# Patient Record
Sex: Female | Born: 1985 | Race: Black or African American | Hispanic: No | Marital: Married | Smoking: Former smoker
Health system: Southern US, Community
[De-identification: ages and names within clinical notes are randomized; demographics above are authoritative.]

## PROBLEM LIST (undated history)

## (undated) DIAGNOSIS — J45909 Unspecified asthma, uncomplicated: Secondary | ICD-10-CM

## (undated) DIAGNOSIS — B009 Herpesviral infection, unspecified: Secondary | ICD-10-CM

## (undated) DIAGNOSIS — I10 Essential (primary) hypertension: Secondary | ICD-10-CM

## (undated) HISTORY — PX: OVARIAN CYST SURGERY: SHX726

## (undated) HISTORY — DX: Herpesviral infection, unspecified: B00.9

---
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
## 2015-09-11 DIAGNOSIS — Z2839 Other underimmunization status: Secondary | ICD-10-CM | POA: Insufficient documentation

## 2015-09-11 DIAGNOSIS — R768 Other specified abnormal immunological findings in serum: Secondary | ICD-10-CM | POA: Insufficient documentation

## 2016-03-04 DIAGNOSIS — O139 Gestational [pregnancy-induced] hypertension without significant proteinuria, unspecified trimester: Secondary | ICD-10-CM

## 2016-07-15 DIAGNOSIS — J4520 Mild intermittent asthma, uncomplicated: Secondary | ICD-10-CM | POA: Insufficient documentation

## 2019-03-30 DIAGNOSIS — Z8632 Personal history of gestational diabetes: Secondary | ICD-10-CM

## 2019-03-30 DIAGNOSIS — Z8759 Personal history of other complications of pregnancy, childbirth and the puerperium: Secondary | ICD-10-CM | POA: Insufficient documentation

## 2019-03-30 HISTORY — DX: Personal history of gestational diabetes: Z86.32

## 2019-07-26 DIAGNOSIS — Z419 Encounter for procedure for purposes other than remedying health state, unspecified: Secondary | ICD-10-CM | POA: Diagnosis not present

## 2019-08-03 ENCOUNTER — Other Ambulatory Visit: Payer: Self-pay

## 2019-08-03 ENCOUNTER — Emergency Department (HOSPITAL_COMMUNITY)
Admission: EM | Admit: 2019-08-03 | Discharge: 2019-08-03 | Disposition: A | Discharge status: Home / Self Care | Payer: Medicaid Other | Attending: Emergency Medicine | Admitting: Emergency Medicine

## 2019-08-03 ENCOUNTER — Emergency Department (HOSPITAL_COMMUNITY): Payer: Medicaid Other

## 2019-08-03 ENCOUNTER — Encounter (HOSPITAL_COMMUNITY): Payer: Self-pay | Admitting: Emergency Medicine

## 2019-08-03 DIAGNOSIS — R079 Chest pain, unspecified: Secondary | ICD-10-CM | POA: Diagnosis not present

## 2019-08-03 DIAGNOSIS — R072 Precordial pain: Secondary | ICD-10-CM | POA: Diagnosis not present

## 2019-08-03 DIAGNOSIS — O26891 Other specified pregnancy related conditions, first trimester: Secondary | ICD-10-CM | POA: Insufficient documentation

## 2019-08-03 DIAGNOSIS — Z3201 Encounter for pregnancy test, result positive: Secondary | ICD-10-CM

## 2019-08-03 DIAGNOSIS — Z3A00 Weeks of gestation of pregnancy not specified: Secondary | ICD-10-CM | POA: Insufficient documentation

## 2019-08-03 LAB — CBC
HCT: 43.5 % (ref 36.0–46.0)
Hemoglobin: 14.6 g/dL (ref 12.0–15.0)
MCH: 31.2 pg (ref 26.0–34.0)
MCHC: 33.6 g/dL (ref 30.0–36.0)
MCV: 92.9 fL (ref 80.0–100.0)
Platelets: 193 10*3/uL (ref 150–400)
RBC: 4.68 MIL/uL (ref 3.87–5.11)
RDW: 13.3 % (ref 11.5–15.5)
WBC: 7 10*3/uL (ref 4.0–10.5)
nRBC: 0 % (ref 0.0–0.2)

## 2019-08-03 LAB — BASIC METABOLIC PANEL
Anion gap: 10 (ref 5–15)
BUN: 8 mg/dL (ref 6–20)
CO2: 21 mmol/L — ABNORMAL LOW (ref 22–32)
Calcium: 9.1 mg/dL (ref 8.9–10.3)
Chloride: 104 mmol/L (ref 98–111)
Creatinine, Ser: 0.56 mg/dL (ref 0.44–1.00)
GFR calc Af Amer: >60 mL/min (ref >60)
GFR calc non Af Amer: >60 mL/min (ref >60)
Glucose, Bld: 141 mg/dL — ABNORMAL HIGH (ref 70–99)
Potassium: 3.1 mmol/L — ABNORMAL LOW (ref 3.5–5.1)
Sodium: 135 mmol/L (ref 135–145)

## 2019-08-03 LAB — I-STAT BETA HCG BLOOD, ED (MC, WL, AP ONLY): I-stat hCG, quantitative: >2000 m[IU]/mL — ABNORMAL HIGH (ref <5)

## 2019-08-03 LAB — TROPONIN I (HIGH SENSITIVITY)
Troponin I (High Sensitivity): <2 ng/L (ref <18)
Troponin I (High Sensitivity): <2 ng/L (ref <18)

## 2019-08-03 MED ORDER — SODIUM CHLORIDE 0.9% FLUSH: 3.0000 mL | Freq: Once | INTRAVENOUS | Status: DC

## 2019-08-03 NOTE — Discharge Instructions (Signed)
We saw you in the ER for chest pain.  The work-up in the ER is reassuring.  It is unclear why you are having chest pain.  It could be that the pain is related to pregnancy.  As discussed, the pregnancy test is positive.  Please follow-up with the [facility] doctor as requested.

## 2019-08-03 NOTE — ED Triage Notes (Signed)
Pt here from home with c/o central chest pain non radiating along with some slight sob , pt also had a positive pregnancy test at home

## 2019-08-03 NOTE — ED Notes (Signed)
Pt discharge instructions and follow-up care reviewed with the patient. The patient verbalized understanding of instructions. Pt discharged. 

## 2019-08-03 NOTE — ED Provider Notes (Signed)
Patty Snyder   CSN: [id number] Arrival date & time: 08/03/19  0831     History Chief Complaint  Patient presents with   Chest Pain   Possible Pregnancy    Patty Snyder is a 34 y.o. female.  HPI  HPI: A 34 year old patient with a history of obesity presents for evaluation of chest pain. Initial onset of pain was less than one hour ago. The patient's chest pain is not worse with exertion. The patient's chest pain is middle- or left-sided, is not well-localized, is not described as heaviness/pressure/tightness, is not sharp and does not radiate to the arms/jaw/neck. The patient does not complain of nausea and denies diaphoresis. The patient has no history of stroke, has no history of peripheral artery disease, has not smoked in the past 90 days, denies any history of treated diabetes, has no relevant family history of coronary artery disease (first degree relative at less than age 44), is not hypertensive and has no history of hypercholesterolemia.   35 year old comes in a chief complaint of chest pain and possible pregnancy.  She reports that over the past few days she has had episodes of chest pain.  The chest pain is located in the midsternal region and right side.  The pain is intermittent, with no specific evoking, precipitating, aggravating or relieving factors.  Pain is not worse in different positions, it is not positional or exertional.  She has no associated cough, shortness of breath.  Patient is also concerned that she could be pregnant.  LMP was in May.  She is now G5 P4 with history of gestational hypertension and gestational diabetes.  She has no abdominal pain, vaginal bleeding, UTI-like symptoms.  No past medical history on file.  There are no problems to display for this patient.   OB History    Gravida  1   Para      Term      Preterm      AB      Living        SAB      TAB      Ectopic       Multiple      Live Births              No family history on file.  Social History   Tobacco Use   Smoking status: Not on file  Substance Use Topics   Alcohol use: Not on file   Drug use: Not on file    Home Medications Prior to Admission medications   Medication Sig Start Date End Date Taking? Authorizing Provider  acetaminophen (TYLENOL) 325 MG tablet Take 325-650 mg by mouth as needed for mild pain or moderate pain.   Yes [provider]  albuterol (PROAIR HFA) 108 (90 Base) MCG/ACT inhaler Inhale 1-2 puffs into the lungs every 4 (four) hours as needed for wheezing. 06/26/18  Yes [provider]  albuterol (PROVENTIL) (2.5 MG/3ML) 0.083% nebulizer solution Take 3 mLs by nebulization every 4 (four) hours as needed for wheezing. 04/03/18  Yes [provider]  Fluticasone-Salmeterol (ADVAIR) 250-50 MCG/DOSE AEPB Inhale 1 puff into the lungs daily. 04/03/18  Yes [provider]  Prenatal Vit-Fe Fumarate-FA (PRENATAL PO) Take 1 tablet by mouth daily.   Yes [provider]    Allergies    Iodine and Shellfish allergy  Review of Systems   Review of Systems  Constitutional: Positive for activity change.  Respiratory: Positive for  shortness of breath.   Cardiovascular: Positive for chest pain.  Gastrointestinal: Negative for abdominal pain.  All other systems reviewed and are negative.   Physical Exam Updated Vital Signs BP (!) 120/58    Pulse 71    Temp 98.5 F (36.9 C) (Oral)    Resp 16    Ht 6' (1.829 m)    Wt 127 kg    LMP 06/03/2019 Comment: 7-8 wks preg   SpO2 100%    BMI 37.97 kg/m   Physical Exam Vitals and nursing Snyder reviewed.  Constitutional:      Appearance: She is well-developed.  HENT:     Head: Normocephalic and atraumatic.  Cardiovascular:     Rate and Rhythm: Normal rate.  Pulmonary:     Effort: Pulmonary effort is normal.  Abdominal:     General: Bowel sounds are normal.  Musculoskeletal:     Cervical  back: Normal range of motion and neck supple.  Skin:    General: Skin is warm and dry.  Neurological:     Mental Status: She is alert and oriented to person, place, and time.     ED Results / Procedures / Treatments   Labs (all labs ordered are listed, but only abnormal results are displayed) Labs Reviewed  BASIC METABOLIC PANEL - Abnormal; Notable for the following components:      Result Value   Potassium 3.1 (*)    CO2 21 (*)    Glucose, Bld 141 (*)    All other components within normal limits  I-STAT BETA HCG BLOOD, ED ([facility], [facility], [facility] ONLY) - Abnormal; Notable for the following components:   I-stat hCG, quantitative >2,000.0 (*)    All other components within normal limits  CBC  TROPONIN I (HIGH SENSITIVITY)  TROPONIN I (HIGH SENSITIVITY)    EKG EKG Interpretation  Date/Time:  Friday August 03 2019 09:04:02 EDT Ventricular Rate:  83 PR Interval:  174 QRS Duration: 82 QT Interval:  376 QTC Calculation: 441 R Axis:   55 Text Interpretation: Normal sinus rhythm Possible Left atrial enlargement Cannot rule out Anterior infarct , age undetermined Abnormal ECG No acute changes Confirmed by Derwood Kaplan [phone]) on 08/03/2019 10:12:18 AM   Radiology DG Chest 2 View  Result Date: 08/03/2019 CLINICAL DATA:  Chest pain EXAM: CHEST - 2 VIEW COMPARISON:  None. FINDINGS: The heart size and mediastinal contours are within normal limits. Both lungs are clear. The visualized skeletal structures are unremarkable. IMPRESSION: No active cardiopulmonary disease. Electronically Signed   By: Helyn Numbers MD   On: 08/03/2019 10:13    Procedures Procedures (including critical care time)  Medications Ordered in ED Medications  sodium chloride flush (NS) 0.9 % injection 3 mL (has no administration in time range)    ED Course  I have reviewed the triage vital signs and the nursing notes.  Pertinent labs & imaging results that were available during my care of the patient were reviewed by  me and considered in my medical decision making (see chart for details).    MDM Rules/Calculators/A&P HEAR Score: 1                        34 year old female comes in primarily for chest discomfort.  Chest discomfort is nonspecific, and intermittent. It does not appear that she is having ACS, PE.  Delta troponin ordered negative.  EKG is reassuring.  Could be related to early changes in pregnancy.  Additionally we have confirmed  for her that she is pregnant.  She is not having any OB related complaints or concerns.  Outpatient OB follow-up provided.  Final Clinical Impression(s) / ED Diagnoses Final diagnoses:  Positive pregnancy test  Precordial chest pain    Rx / DC Orders ED Discharge Orders    None       Derwood Kaplan, MD 08/03/19 1147

## 2019-08-09 ENCOUNTER — Other Ambulatory Visit: Payer: Self-pay

## 2019-08-09 ENCOUNTER — Inpatient Hospital Stay (HOSPITAL_COMMUNITY)
Admission: AD | Admit: 2019-08-09 | Discharge: 2019-08-09 | Disposition: A | Discharge status: Home / Self Care | Payer: Medicaid Other | Attending: Family Medicine | Admitting: Family Medicine

## 2019-08-09 ENCOUNTER — Encounter (HOSPITAL_COMMUNITY): Payer: Self-pay | Admitting: *Deleted

## 2019-08-09 ENCOUNTER — Inpatient Hospital Stay (HOSPITAL_COMMUNITY): Payer: Medicaid Other

## 2019-08-09 DIAGNOSIS — O4691 Antepartum hemorrhage, unspecified, first trimester: Secondary | ICD-10-CM

## 2019-08-09 DIAGNOSIS — Z7951 Long term (current) use of inhaled steroids: Secondary | ICD-10-CM | POA: Insufficient documentation

## 2019-08-09 DIAGNOSIS — R109 Unspecified abdominal pain: Secondary | ICD-10-CM

## 2019-08-09 DIAGNOSIS — O208 Other hemorrhage in early pregnancy: Secondary | ICD-10-CM | POA: Diagnosis not present

## 2019-08-09 DIAGNOSIS — O26891 Other specified pregnancy related conditions, first trimester: Secondary | ICD-10-CM | POA: Insufficient documentation

## 2019-08-09 DIAGNOSIS — B9689 Other specified bacterial agents as the cause of diseases classified elsewhere: Secondary | ICD-10-CM

## 2019-08-09 DIAGNOSIS — O4690 Antepartum hemorrhage, unspecified, unspecified trimester: Secondary | ICD-10-CM | POA: Diagnosis present

## 2019-08-09 DIAGNOSIS — Z3A08 8 weeks gestation of pregnancy: Secondary | ICD-10-CM | POA: Insufficient documentation

## 2019-08-09 DIAGNOSIS — O209 Hemorrhage in early pregnancy, unspecified: Secondary | ICD-10-CM | POA: Insufficient documentation

## 2019-08-09 DIAGNOSIS — O219 Vomiting of pregnancy, unspecified: Secondary | ICD-10-CM | POA: Insufficient documentation

## 2019-08-09 DIAGNOSIS — Z888 Allergy status to other drugs, medicaments and biological substances status: Secondary | ICD-10-CM | POA: Diagnosis not present

## 2019-08-09 DIAGNOSIS — O23591 Infection of other part of genital tract in pregnancy, first trimester: Secondary | ICD-10-CM

## 2019-08-09 DIAGNOSIS — R102 Pelvic and perineal pain: Secondary | ICD-10-CM | POA: Insufficient documentation

## 2019-08-09 HISTORY — DX: Unspecified asthma, uncomplicated: J45.909

## 2019-08-09 LAB — URINALYSIS, ROUTINE W REFLEX MICROSCOPIC
Bilirubin Urine: NEGATIVE
Glucose, UA: NEGATIVE mg/dL
Hgb urine dipstick: NEGATIVE
Ketones, ur: NEGATIVE mg/dL
Leukocytes,Ua: NEGATIVE
Nitrite: NEGATIVE
Protein, ur: NEGATIVE mg/dL
Specific Gravity, Urine: 1.019 (ref 1.005–1.030)
pH: 6 (ref 5.0–8.0)

## 2019-08-09 LAB — CBC
HCT: 39.7 % (ref 36.0–46.0)
Hemoglobin: 13.3 g/dL (ref 12.0–15.0)
MCH: 30.8 pg (ref 26.0–34.0)
MCHC: 33.5 g/dL (ref 30.0–36.0)
MCV: 91.9 fL (ref 80.0–100.0)
Platelets: 184 10*3/uL (ref 150–400)
RBC: 4.32 MIL/uL (ref 3.87–5.11)
RDW: 13.2 % (ref 11.5–15.5)
WBC: 7.2 10*3/uL (ref 4.0–10.5)
nRBC: 0 % (ref 0.0–0.2)

## 2019-08-09 LAB — COMPREHENSIVE METABOLIC PANEL
ALT: 11 U/L (ref 0–44)
AST: 14 U/L — ABNORMAL LOW (ref 15–41)
Albumin: 3.3 g/dL — ABNORMAL LOW (ref 3.5–5.0)
Alkaline Phosphatase: 35 U/L — ABNORMAL LOW (ref 38–126)
Anion gap: 9 (ref 5–15)
BUN: 10 mg/dL (ref 6–20)
CO2: 20 mmol/L — ABNORMAL LOW (ref 22–32)
Calcium: 8.9 mg/dL (ref 8.9–10.3)
Chloride: 106 mmol/L (ref 98–111)
Creatinine, Ser: 0.45 mg/dL (ref 0.44–1.00)
GFR calc Af Amer: >60 mL/min (ref >60)
GFR calc non Af Amer: >60 mL/min (ref >60)
Glucose, Bld: 99 mg/dL (ref 70–99)
Potassium: 3.6 mmol/L (ref 3.5–5.1)
Sodium: 135 mmol/L (ref 135–145)
Total Bilirubin: 0.5 mg/dL (ref 0.3–1.2)
Total Protein: 6 g/dL — ABNORMAL LOW (ref 6.5–8.1)

## 2019-08-09 LAB — WET PREP, GENITAL
Sperm: NONE SEEN
Trich, Wet Prep: NONE SEEN
Yeast Wet Prep HPF POC: NONE SEEN

## 2019-08-09 LAB — I-STAT BETA HCG BLOOD, ED (NOT ORDERABLE): I-stat hCG, quantitative: >2000 m[IU]/mL — ABNORMAL HIGH (ref <5)

## 2019-08-09 LAB — GC/CHLAMYDIA PROBE AMP (~~LOC~~) NOT AT ARMC
Chlamydia: NEGATIVE
Comment: NEGATIVE
Comment: NORMAL
Neisseria Gonorrhea: NEGATIVE

## 2019-08-09 MED ORDER — METRONIDAZOLE 500 MG PO TABS: 500.0000 mg | ORAL_TABLET | Freq: Two times a day (BID) | ORAL | 0 refills | Status: AC

## 2019-08-09 NOTE — MAU Provider Note (Signed)
Chief Complaint: Vaginal Bleeding   First Provider Initiated Contact with Patient 08/09/19 0211        SUBJECTIVE HPI: Patty Snyder is a 35 y.o. G1P0 at Unknown by LMP who presents to maternity admissions reporting bleeding and pelvic pain.  Has an OB in [place] [facility]per) but has not seen them yet.  No problems with prior pregnancies, but did have surgery for "miscarriage with ruptured cyst" once.. She denies vaginal itching/burning, urinary symptoms, h/a, dizziness, n/v, or fever/chills.    Vaginal Bleeding The patient's primary symptoms include pelvic pain and vaginal bleeding. The patient's pertinent negatives include no genital itching, genital lesions or genital odor. This is a new problem. The current episode started today. The problem occurs constantly. The problem has been unchanged. She is pregnant. Associated symptoms include abdominal pain. Pertinent negatives include no chills, constipation, diarrhea, fever, frequency, nausea or vomiting. The vaginal discharge was bloody. The vaginal bleeding is lighter than menses. She has not been passing clots. She has not been passing tissue. Nothing aggravates the symptoms. She has tried nothing for the symptoms.   ED Note: This is a G27P5 34 year old female approximately [redacted] weeks pregnant, who presents to the emergency department with a chief complaint of vaginal bleeding and pelvic cramping.  She states that symptoms started today.  She noticed bleeding after she awoke from a nap and needed to use the bathroom.  She states that the bleeding has since stopped, but she is still having the cramping.  She has not had any obstetric care to this point for this pregnancy.  She denies any trauma.  Denies any other injuries or illness  History reviewed. No pertinent past medical history. History reviewed. No pertinent surgical history. Social History   Socioeconomic History  . Marital status: Married    Spouse name: Not on file  . Number of  children: Not on file  . Years of education: Not on file  . Highest education level: Not on file  Occupational History  . Not on file  Tobacco Use  . Smoking status: Never Smoker  . Smokeless tobacco: Never Used  Substance and Sexual Activity  . Alcohol use: Yes  . Drug use: Not on file  . Sexual activity: Not on file  Other Topics Concern  . Not on file  Social History Narrative  . Not on file   Social Determinants of Health   Financial Resource Strain:   . Difficulty of Paying Living Expenses:   Food Insecurity:   . Worried About Programme researcher, broadcasting/film/video in the Last Year:   . Barista in the Last Year:   Transportation Needs:   . Freight forwarder (Medical):   Marland Kitchen Lack of Transportation (Non-Medical):   Physical Activity:   . Days of Exercise per Week:   . Minutes of Exercise per Session:   Stress:   . Feeling of Stress :   Social Connections:   . Frequency of Communication with Friends and Family:   . Frequency of Social Gatherings with Friends and Family:   . Attends Religious Services:   . Active Member of Clubs or Organizations:   . Attends Banker Meetings:   Marland Kitchen Marital Status:   Intimate Partner Violence:   . Fear of Current or Ex-Partner:   . Emotionally Abused:   Marland Kitchen Physically Abused:   . Sexually Abused:    No current facility-administered medications on file prior to encounter.   Current Outpatient Medications on File Prior  to Encounter  Medication Sig Dispense Refill  . acetaminophen (TYLENOL) 325 MG tablet Take 325-650 mg by mouth as needed for mild pain or moderate pain.    Marland Kitchen albuterol (PROAIR HFA) 108 (90 Base) MCG/ACT inhaler Inhale 1-2 puffs into the lungs every 4 (four) hours as needed for wheezing.    Marland Kitchen albuterol (PROVENTIL) (2.5 MG/3ML) 0.083% nebulizer solution Take 3 mLs by nebulization every 4 (four) hours as needed for wheezing.    . Fluticasone-Salmeterol (ADVAIR) 250-50 MCG/DOSE AEPB Inhale 1 puff into the lungs daily.     . Prenatal Vit-Fe Fumarate-FA (PRENATAL PO) Take 1 tablet by mouth daily.     Allergies  Allergen Reactions  . Iodine Nausea And Vomiting  . Shellfish Allergy Nausea And Vomiting    I have reviewed patient's Past Medical Hx, Surgical Hx, Family Hx, Social Hx, medications and allergies.   ROS:  Review of Systems  Constitutional: Negative for chills and fever.  Gastrointestinal: Positive for abdominal pain. Negative for constipation, diarrhea, nausea and vomiting.  Genitourinary: Positive for pelvic pain and vaginal bleeding. Negative for frequency.   Review of Systems  Other systems negative   Physical Exam  Physical Exam Patient Vitals for the past 24 hrs:  BP Temp Temp src Pulse Resp SpO2 Height Weight  08/09/19 0120 -- -- -- -- -- -- 6' (1.829 m) 127 kg  08/09/19 0112 126/79 98.5 F (36.9 C) Oral 83 16 100 % -- --   Constitutional: Well-developed, well-nourished female in no acute distress.  Cardiovascular: normal rate Respiratory: normal effort GI: Abd soft, non-tender. Pos BS x 4 MS: Extremities nontender, no edema, normal ROM Neurologic: Alert and oriented x 4.  GU: Neg CVAT.  PELVIC EXAM: Cervix pink, visually closed, without lesion, small red bloody discharge, vaginal walls and external genitalia normal Bimanual exam: Cervix 0/long/high, firm, anterior, neg CMT, uterus nontender, nonenlarged, adnexa with mild bilateral tenderness (but mostly right sided) and no enlargement or mass    LAB RESULTS Results for orders placed or performed during the hospital encounter of 08/09/19 (from the past 24 hour(s))  Comprehensive metabolic panel     Status: Abnormal   Collection Time: 08/09/19  1:32 AM  Result Value Ref Range   Sodium 135 135 - 145 mmol/L   Potassium 3.6 3.5 - 5.1 mmol/L   Chloride 106 98 - 111 mmol/L   CO2 20 (L) 22 - 32 mmol/L   Glucose, Bld 99 70 - 99 mg/dL   BUN 10 6 - 20 mg/dL   Creatinine, Ser 8.58 0.44 - 1.00 mg/dL   Calcium 8.9 8.9 - 85.0 mg/dL    Total Protein 6.0 (L) 6.5 - 8.1 g/dL   Albumin 3.3 (L) 3.5 - 5.0 g/dL   AST 14 (L) 15 - 41 U/L   ALT 11 0 - 44 U/L   Alkaline Phosphatase 35 (L) 38 - 126 U/L   Total Bilirubin 0.5 0.3 - 1.2 mg/dL   GFR calc non Af Amer >60 >60 mL/min   GFR calc Af Amer >60 >60 mL/min   Anion gap 9 5 - 15  CBC     Status: None   Collection Time: 08/09/19  1:32 AM  Result Value Ref Range   WBC 7.2 4.0 - 10.5 K/uL   RBC 4.32 3.87 - 5.11 MIL/uL   Hemoglobin 13.3 12.0 - 15.0 g/dL   HCT 27.7 36 - 46 %   MCV 91.9 80.0 - 100.0 fL   MCH 30.8 26.0 - 34.0 pg  MCHC 33.5 30.0 - 36.0 g/dL   RDW 26.9 48.5 - 46.2 %   Platelets 184 150 - 400 K/uL   nRBC 0.0 0.0 - 0.2 %  Wet prep, genital     Status: Abnormal   Collection Time: 08/09/19  2:10 AM   Specimen: Vaginal  Result Value Ref Range   Yeast Wet Prep HPF POC NONE SEEN NONE SEEN   Trich, Wet Prep NONE SEEN NONE SEEN   Clue Cells Wet Prep HPF POC PRESENT (A) NONE SEEN   WBC, Wet Prep HPF POC FEW (A) NONE SEEN   Sperm NONE SEEN   Urinalysis, Routine w reflex microscopic     Status: None   Collection Time: 08/09/19  2:31 AM  Result Value Ref Range   Color, Urine YELLOW YELLOW   APPearance CLEAR CLEAR   Specific Gravity, Urine 1.019 1.005 - 1.030   pH 6.0 5.0 - 8.0   Glucose, UA NEGATIVE NEGATIVE mg/dL   Hgb urine dipstick NEGATIVE NEGATIVE   Bilirubin Urine NEGATIVE NEGATIVE   Ketones, ur NEGATIVE NEGATIVE mg/dL   Protein, ur NEGATIVE NEGATIVE mg/dL   Nitrite NEGATIVE NEGATIVE   Leukocytes,Ua NEGATIVE NEGATIVE    I-Stat was done but result not available  IMAGING US OB Comp Less 14 Wks  Result Date: 08/09/2019 CLINICAL DATA:  34 year old female with bleeding in the 1st trimester of pregnancy. Estimated gestational age by LMP 8 weeks 3 days. EXAM: OBSTETRIC <14 WK ULTRASOUND TECHNIQUE: Transabdominal ultrasound was performed for evaluation of the gestation as well as the maternal uterus and adnexal regions. COMPARISON:  None. FINDINGS:  Intrauterine gestational sac: Single Yolk sac:  Visible Embryo:  Visible Cardiac Activity: Detected Heart Rate: 164 bpm CRL:   19.5 mm   8 w 3 d                  [place] EDC: 03/17/2020 Subchorionic hemorrhage:  None visualized. Maternal uterus/adnexae: No pelvic free fluid. The right ovary appears normal measuring 2.7 x 2.5 x 1.8 cm. The left ovary appears normal measuring 3.0 x 2.3 x 2.2 cm (with possible corpus luteum). IMPRESSION: Single living IUP demonstrated. No acute maternal findings visualized. Electronically Signed   By: Odessa Fleming M.D.   On: 08/09/2019 03:04     MAU Management/MDM: Did not order usual labs, some done in ED.  If [place] is negative, will draw HCG. Pelvic exam and cultures done Will check baseline Ultrasound to rule out ectopic.  This bleeding/pain can represent a normal pregnancy with bleeding, spontaneous abortion or even an ectopic which can be life-threatening.  The process as listed above helps to determine which of these is present.  Reviewed results of live SIUP at [redacted]w[redacted]d Discussed bacterial vaginosis and treatment Recommend call OB and update them   ASSESSMENT 1. Vaginal bleeding in pregnancy   2. Vaginal bleeding in pregnancy, first trimester   3. Abdominal pain affecting pregnancy   4. Vaginal bleeding in pregnancy, first trimester   5. Abdominal pain affecting pregnancy     PLAN Discharge home Rx Flagyl for BV Has appt next week for new OB at [facility] Pelvic rest  Pt stable at time of discharge. Encouraged to return here or to other Urgent Care/ED if she develops worsening of symptoms, increase in pain, fever, or other concerning symptoms.    Wynelle Bourgeois CNM, MSN Certified Nurse-Midwife 08/09/2019  2:11 AM

## 2019-08-09 NOTE — Discharge Instructions (Signed)
Vaginal Bleeding During Pregnancy, First Trimester  A small amount of bleeding (spotting) from the vagina is common during early pregnancy. Sometimes the bleeding is normal and does not cause problems. At other times, though, bleeding may be a sign of something serious. Tell your doctor about any bleeding from your vagina right away. Follow these instructions at home: Activity  Follow your doctor's instructions about how active you can be.  If needed, make plans for someone to help with your normal activities.  Do not have sex or orgasms until your doctor says that this is safe. General instructions  Take over-the-counter and prescription medicines only as told by your doctor.  Watch your condition for any changes.  Write down: ? The number of pads you use each day. ? How often you change pads. ? How soaked (saturated) your pads are.  Do not use tampons.  Do not douche.  If you pass any tissue from your vagina, save it to show to your doctor.  Keep all follow-up visits as told by your doctor. This is important. Contact a doctor if:  You have vaginal bleeding at any time while you are pregnant.  You have cramps.  You have a fever. Get help right away if:  You have very bad cramps in your back or belly (abdomen).  You pass large clots or a lot of tissue from your vagina.  Your bleeding gets worse.  You feel light-headed.  You feel weak.  You pass out (faint).  You have chills.  You are leaking fluid from your vagina.  You have a gush of fluid from your vagina. Summary  Sometimes vaginal bleeding during pregnancy is normal and does not cause problems. At other times, bleeding may be a sign of something serious.  Tell your doctor about any bleeding from your vagina right away.  Follow your doctor's instructions about how active you can be. You may need someone to help you with your normal activities. This information is not intended to replace advice given to  you by your health care provider. Make sure you discuss any questions you have with your health care provider. Document Revised: 05/02/2018 Document Reviewed: 04/14/2016 Elsevier Patient Education  2020 [employer] Abdominal Pain During Pregnancy  Abdominal pain is common during pregnancy, and has many possible causes. Some causes are more serious than others, and sometimes the cause is not known. Abdominal pain can be a sign that labor is starting. It can also be caused by normal growth and stretching of muscles and ligaments during pregnancy. Always tell your health care provider if you have any abdominal pain. Follow these instructions at home:  Do not have sex or put anything in your vagina until your pain goes away completely.  Get plenty of rest until your pain improves.  Drink enough fluid to keep your urine pale yellow.  Take over-the-counter and prescription medicines only as told by your health care provider.  Keep all follow-up visits as told by your health care provider. This is important. Contact a health care provider if:  Your pain continues or gets worse after resting.  You have lower abdominal pain that: ? Comes and goes at regular intervals. ? Spreads to your back. ? Is similar to menstrual cramps.  You have pain or burning when you urinate. Get help right away if:  You have a fever or chills.  You have vaginal bleeding.  You are leaking fluid from your vagina.  You are passing tissue from your vagina.  You have vomiting or diarrhea that lasts for more than 24 hours.  Your baby is moving less than usual.  You feel very weak or faint.  You have shortness of breath.  You develop severe pain in your upper abdomen. Summary  Abdominal pain is common during pregnancy, and has many possible causes.  If you experience abdominal pain during pregnancy, tell your health care provider right away.  Follow your health care provider's home care instructions  and keep all follow-up visits as directed. This information is not intended to replace advice given to you by your health care provider. Make sure you discuss any questions you have with your health care provider. Document Revised: 05/01/2018 Document Reviewed: 04/15/2016 Elsevier Patient Education  2020 [employer] Inc[employer]  Bacterial Vaginosis  Bacterial vaginosis is an infection of the vagina. It happens when too many normal germs (healthy bacteria) grow in the vagina. This infection puts you at risk for infections from sex (STIs). Treating this infection can lower your risk for some STIs. You should also treat this if you are pregnant. It can cause your baby to be born early. Follow these instructions at home: Medicines  Take over-the-counter and prescription medicines only as told by your doctor.  Take or use your antibiotic medicine as told by your doctor. Do not stop taking or using it even if you start to feel better. General instructions  If you your sexual partner is a woman, tell her that you have this infection. She needs to get treatment if she has symptoms. If you have a female partner, he does not need to be treated.  During treatment: ? Avoid sex. ? Do not douche. ? Avoid alcohol as told. ? Avoid breastfeeding as told.  Drink enough fluid to keep your pee (urine) clear or pale yellow.  Keep your vagina and butt (rectum) clean. ? Wash the area with warm water every day. ? Wipe from front to back after you use the toilet.  Keep all follow-up visits as told by your doctor. This is important. Preventing this condition  Do not douche.  Use only warm water to wash around your vagina.  Use protection when you have sex. This includes: ? Latex condoms. ? Dental dams.  Limit how many people you have sex with. It is best to only have sex with the same person (be monogamous).  Get tested for STIs. Have your partner get tested.  Wear underwear that is cotton or lined with  cotton.  Avoid tight pants and pantyhose. This is most important in summer.  Do not use any products that have nicotine or tobacco in them. These include cigarettes and e-cigarettes. If you need help quitting, ask your doctor.  Do not use illegal drugs.  Limit how much alcohol you drink. Contact a doctor if:  Your symptoms do not get better, even after you are treated.  You have more discharge or pain when you pee (urinate).  You have a fever.  You have pain in your belly (abdomen).  You have pain with sex.  Your bleed from your vagina between periods. Summary  This infection happens when too many germs (bacteria) grow in the vagina.  Treating this condition can lower your risk for some infections from sex (STIs).  You should also treat this if you are pregnant. It can cause early (premature) birth.  Do not stop taking or using your antibiotic medicine even if you start to feel better. This information is not intended to replace advice  given to you by your health care provider. Make sure you discuss any questions you have with your health care provider. Document Revised: 12/24/2016 Document Reviewed: 09/27/2015 Elsevier Patient Education  2020 [employer].

## 2019-08-09 NOTE — ED Triage Notes (Signed)
The pt is c/o some vaginal bleeding whenever she goes to the br since yesterday and she has had abd cramping no bleeding other than when she goes to the br  Home preg test lmp may 17th [facility] an [facility]

## 2019-08-09 NOTE — ED Provider Notes (Signed)
[facility] EMERGENCY DEPARTMENT Provider Note   CSN: [id number] Arrival date & time: 08/09/19  0100     History Chief Complaint  Patient presents with  . Vaginal Bleeding    Patty Snyder is a 34 y.o. female.  This is a G3P46 34 year old female approximately [redacted] weeks pregnant, who presents to the emergency department with a chief complaint of vaginal bleeding and pelvic cramping.  She states that symptoms started today.  She noticed bleeding after she awoke from a nap and needed to use the bathroom.  She states that the bleeding has since stopped, but she is still having the cramping.  She has not had any obstetric care to this point for this pregnancy.  She denies any trauma.  Denies any other injuries or illness.  The history is provided by the patient. No language interpreter was used.       History reviewed. No pertinent past medical history.  There are no problems to display for this patient.   History reviewed. No pertinent surgical history.   OB History    Gravida  1   Para      Term      Preterm      AB      Living        SAB      TAB      Ectopic      Multiple      Live Births              No family history on file.  Social History   Tobacco Use  . Smoking status: Never Smoker  . Smokeless tobacco: Never Used  Substance Use Topics  . Alcohol use: Yes  . Drug use: Not on file    Home Medications Prior to Admission medications   Medication Sig Start Date End Date Taking? Authorizing Provider  acetaminophen (TYLENOL) 325 MG tablet Take 325-650 mg by mouth as needed for mild pain or moderate pain.    [provider]  albuterol (PROAIR HFA) 108 (90 Base) MCG/ACT inhaler Inhale 1-2 puffs into the lungs every 4 (four) hours as needed for wheezing. 06/26/18   [provider]  albuterol (PROVENTIL) (2.5 MG/3ML) 0.083% nebulizer solution Take 3 mLs by nebulization every 4 (four) hours as needed for wheezing.  04/03/18   [provider]  Fluticasone-Salmeterol (ADVAIR) 250-50 MCG/DOSE AEPB Inhale 1 puff into the lungs daily. 04/03/18   [provider]  Prenatal Vit-Fe Fumarate-FA (PRENATAL PO) Take 1 tablet by mouth daily.    [provider]    Allergies    Iodine and Shellfish allergy  Review of Systems   Review of Systems  All other systems reviewed and are negative.   Physical Exam Updated Vital Signs BP 126/79 (BP Location: Left Arm)   Pulse 83   Temp 98.5 F (36.9 C) (Oral)   Resp 16   Ht 6' (1.829 m)   Wt 127 kg   LMP 06/03/2019 Comment: 7-8 wks preg  SpO2 100%   BMI 37.97 kg/m   Physical Exam Vitals and nursing note reviewed.  Constitutional:      General: She is not in acute distress.    Appearance: She is well-developed.  HENT:     Head: Normocephalic and atraumatic.  Eyes:     Conjunctiva/sclera: Conjunctivae normal.  Cardiovascular:     Rate and Rhythm: Normal rate.     Heart sounds: No murmur heard.   Pulmonary:  Effort: Pulmonary effort is normal. No respiratory distress.  Abdominal:     General: There is no distension.     Tenderness: There is no abdominal tenderness.     Comments: No focal abdominal tenderness  Musculoskeletal:     Cervical back: Neck supple.     Comments: Moves all extremities  Skin:    General: Skin is warm and dry.  Neurological:     Mental Status: She is alert and oriented to person, place, and time.  Psychiatric:        Mood and Affect: Mood normal.        Behavior: Behavior normal.     ED Results / Procedures / Treatments   Labs (all labs ordered are listed, but only abnormal results are displayed) Labs Reviewed  COMPREHENSIVE METABOLIC PANEL  CBC  URINALYSIS, ROUTINE W REFLEX MICROSCOPIC  I-STAT BETA HCG BLOOD, ED ([facility], [facility], [facility] ONLY)    EKG None  Radiology No results found.  Procedures Procedures (including critical care time)  Medications Ordered in ED Medications - No data to  display  ED Course  I have reviewed the triage vital signs and the nursing notes.  Pertinent labs & imaging results that were available during my care of the patient were reviewed by me and considered in my medical decision making (see chart for details).    MDM Rules/Calculators/A&P                         Patient with vaginal bleeding in pregnancy.  VSS.  No focal tenderness on exam.  Discussed with Hilda Lias, APP at [facility], who will accept in transfer for r/o ectopic.  Final Clinical Impression(s) / ED Diagnoses Final diagnoses:  Vaginal bleeding in pregnancy    Rx / DC Orders ED Discharge Orders    None       Roxy Horseman, PA-C 08/09/19 0149    Mesner, Barbara Cower, MD 08/09/19 0320

## 2019-08-15 DIAGNOSIS — Z3687 Encounter for antenatal screening for uncertain dates: Secondary | ICD-10-CM | POA: Diagnosis not present

## 2019-08-27 DIAGNOSIS — Z3490 Encounter for supervision of normal pregnancy, unspecified, unspecified trimester: Secondary | ICD-10-CM | POA: Diagnosis not present

## 2019-08-27 DIAGNOSIS — Z6841 Body Mass Index (BMI) 40.0 and over, adult: Secondary | ICD-10-CM | POA: Insufficient documentation

## 2019-09-05 DIAGNOSIS — L700 Acne vulgaris: Secondary | ICD-10-CM | POA: Diagnosis not present

## 2019-09-13 DIAGNOSIS — Z3480 Encounter for supervision of other normal pregnancy, unspecified trimester: Secondary | ICD-10-CM | POA: Diagnosis not present

## 2019-09-26 DIAGNOSIS — Z419 Encounter for procedure for purposes other than remedying health state, unspecified: Secondary | ICD-10-CM | POA: Diagnosis not present

## 2019-10-17 DIAGNOSIS — Z8632 Personal history of gestational diabetes: Secondary | ICD-10-CM | POA: Diagnosis not present

## 2019-10-17 DIAGNOSIS — Z8759 Personal history of other complications of pregnancy, childbirth and the puerperium: Secondary | ICD-10-CM | POA: Diagnosis not present

## 2019-10-17 DIAGNOSIS — O99212 Obesity complicating pregnancy, second trimester: Secondary | ICD-10-CM | POA: Diagnosis not present

## 2019-10-17 DIAGNOSIS — O09292 Supervision of pregnancy with other poor reproductive or obstetric history, second trimester: Secondary | ICD-10-CM | POA: Diagnosis not present

## 2019-10-25 DIAGNOSIS — N760 Acute vaginitis: Secondary | ICD-10-CM | POA: Diagnosis not present

## 2019-12-04 DIAGNOSIS — Z3A25 25 weeks gestation of pregnancy: Secondary | ICD-10-CM | POA: Diagnosis not present

## 2019-12-04 DIAGNOSIS — O99212 Obesity complicating pregnancy, second trimester: Secondary | ICD-10-CM | POA: Diagnosis not present

## 2019-12-04 DIAGNOSIS — Z6841 Body Mass Index (BMI) 40.0 and over, adult: Secondary | ICD-10-CM | POA: Diagnosis not present

## 2019-12-04 DIAGNOSIS — Z8632 Personal history of gestational diabetes: Secondary | ICD-10-CM | POA: Diagnosis not present

## 2019-12-13 DIAGNOSIS — Z3480 Encounter for supervision of other normal pregnancy, unspecified trimester: Secondary | ICD-10-CM | POA: Diagnosis not present

## 2020-01-10 DIAGNOSIS — O0993 Supervision of high risk pregnancy, unspecified, third trimester: Secondary | ICD-10-CM | POA: Diagnosis not present

## 2020-01-10 DIAGNOSIS — Z6841 Body Mass Index (BMI) 40.0 and over, adult: Secondary | ICD-10-CM | POA: Diagnosis not present

## 2020-01-10 DIAGNOSIS — Z8759 Personal history of other complications of pregnancy, childbirth and the puerperium: Secondary | ICD-10-CM | POA: Diagnosis not present

## 2020-01-15 ENCOUNTER — Encounter (HOSPITAL_COMMUNITY): Payer: Self-pay | Admitting: Obstetrics and Gynecology

## 2020-01-15 ENCOUNTER — Inpatient Hospital Stay (HOSPITAL_COMMUNITY)
Admission: EM | Admit: 2020-01-15 | Discharge: 2020-01-15 | Disposition: A | Discharge status: Home / Self Care | Payer: Medicaid Other | Attending: Emergency Medicine | Admitting: Emergency Medicine

## 2020-01-15 ENCOUNTER — Other Ambulatory Visit: Payer: Self-pay

## 2020-01-15 ENCOUNTER — Inpatient Hospital Stay (HOSPITAL_BASED_OUTPATIENT_CLINIC_OR_DEPARTMENT_OTHER): Payer: Medicaid Other

## 2020-01-15 DIAGNOSIS — Z7982 Long term (current) use of aspirin: Secondary | ICD-10-CM | POA: Diagnosis not present

## 2020-01-15 DIAGNOSIS — O2243 Hemorrhoids in pregnancy, third trimester: Secondary | ICD-10-CM | POA: Diagnosis not present

## 2020-01-15 DIAGNOSIS — K5900 Constipation, unspecified: Secondary | ICD-10-CM | POA: Diagnosis not present

## 2020-01-15 DIAGNOSIS — K649 Unspecified hemorrhoids: Secondary | ICD-10-CM

## 2020-01-15 DIAGNOSIS — Z3A31 31 weeks gestation of pregnancy: Secondary | ICD-10-CM

## 2020-01-15 DIAGNOSIS — O209 Hemorrhage in early pregnancy, unspecified: Secondary | ICD-10-CM

## 2020-01-15 DIAGNOSIS — O99613 Diseases of the digestive system complicating pregnancy, third trimester: Secondary | ICD-10-CM

## 2020-01-15 DIAGNOSIS — O26853 Spotting complicating pregnancy, third trimester: Secondary | ICD-10-CM | POA: Insufficient documentation

## 2020-01-15 DIAGNOSIS — O4693 Antepartum hemorrhage, unspecified, third trimester: Secondary | ICD-10-CM | POA: Diagnosis not present

## 2020-01-15 DIAGNOSIS — J45909 Unspecified asthma, uncomplicated: Secondary | ICD-10-CM | POA: Diagnosis not present

## 2020-01-15 LAB — WET PREP, GENITAL
Clue Cells Wet Prep HPF POC: NONE SEEN
Sperm: NONE SEEN
Trich, Wet Prep: NONE SEEN
Yeast Wet Prep HPF POC: NONE SEEN

## 2020-01-15 LAB — URINALYSIS, ROUTINE W REFLEX MICROSCOPIC
Bilirubin Urine: NEGATIVE
Glucose, UA: NEGATIVE mg/dL
Hgb urine dipstick: NEGATIVE
Ketones, ur: NEGATIVE mg/dL
Leukocytes,Ua: NEGATIVE
Nitrite: NEGATIVE
Protein, ur: NEGATIVE mg/dL
Specific Gravity, Urine: 1.016 (ref 1.005–1.030)
pH: 8 (ref 5.0–8.0)

## 2020-01-15 MED ORDER — POLYETHYLENE GLYCOL 3350 17 G PO PACK
17.0000 g | PACK | Freq: Every day | ORAL | 0 refills | Status: DC
Start: 2020-01-15 — End: 2020-08-04

## 2020-01-15 MED ORDER — DOCUSATE SODIUM 100 MG PO CAPS
100.0000 mg | ORAL_CAPSULE | Freq: Two times a day (BID) | ORAL | 2 refills | Status: DC | PRN
Start: 2020-01-15 — End: 2020-08-04

## 2020-01-15 NOTE — ED Notes (Signed)
PA Joldersma assessed patient and advised patient can be transported to [facility]. Transport called to take patient over.

## 2020-01-15 NOTE — MAU Note (Signed)
.   Patty Snyder is a 34 y.o. at [redacted]w[redacted]d here in [facility] reporting: vaginal bleeding that started x2 days ago. She states that this morning she woke up and it was like she was on her period with bring red blood and she was passing dime sized clots. She denies LOF. Endorses good fetal movement. States that she has been constipated for the past week and has been straining to have a BM, unsure of where blood is coming from.   Pain score: 0 Vitals:   01/15/20 1020 01/15/20 1123  BP: 132/79 118/67  Pulse: 98 92  Resp: 16 18  Temp: 99 F (37.2 C) 98.3 F (36.8 C)  SpO2: 100% 100%     FHT:144 Lab orders placed from triage: UA

## 2020-01-15 NOTE — ED Provider Notes (Signed)
MSE was initiated and I personally evaluated the patient and placed orders (if any) at  10:35 AM on January 15, 2020.   Patient is a G62 34 year old female who is approximately [redacted] weeks pregnant presents to the emergency department due to bleeding.  Patient states she had an episode about 2 days ago after having a bowel movement.  She states she has been more constipated than normal recently.  Feels that she is straining more than normal.  She was unsure if the bleeding came from her vagina or her anus.  She reports she had another episode this morning that was worse than the prior episode.  She had blood in and around the toilet as well as on her legs.  Patient reports associated clots.  Again, unsure of the location of the bleeding.  Patient notes chronic shortness of breath as well as a history of asthma.  Not acutely worsened.  No chest pain, rectal pain, or abdominal pain.  Patient discussed with [facility] APP, Sam.  They will accept patient.  Patient will be transported to [facility] at this time.  The patient appears stable so that the remainder of the MSE may be completed by another provider.   Placido Sou, PA-C 01/15/20 1039    Cheryll Cockayne, MD 01/15/20 [phone]

## 2020-01-15 NOTE — Discharge Instructions (Signed)
Hemorrhoids Hemorrhoids are swollen veins that may develop:  In the butt (rectum). These are called internal hemorrhoids.  Around the opening of the butt (anus). These are called external hemorrhoids. Hemorrhoids can cause pain, itching, or bleeding. Most of the time, they do not cause serious problems. They usually get better with diet changes, lifestyle changes, and other home treatments. What are the causes? This condition may be caused by:  Having trouble pooping (constipation).  Pushing hard (straining) to poop.  Watery poop (diarrhea).  Pregnancy.  Being very overweight (obese).  Sitting for long periods of time.  Heavy lifting or other activity that causes you to strain.  Anal sex.  Riding a bike for a long period of time. What are the signs or symptoms? Symptoms of this condition include:  Pain.  Itching or soreness in the butt.  Bleeding from the butt.  Leaking poop.  Swelling in the area.  One or more lumps around the opening of your butt. How is this diagnosed? A doctor can often diagnose this condition by looking at the affected area. The doctor may also:  Do an exam that involves feeling the area with a gloved hand (digital rectal exam).  Examine the area inside your butt using a small tube (anoscope).  Order blood tests. This may be done if you have lost a lot of blood.  Have you get a test that involves looking inside the colon using a flexible tube with a camera on the end (sigmoidoscopy or colonoscopy). How is this treated? This condition can usually be treated at home. Your doctor may tell you to change what you eat, make lifestyle changes, or try home treatments. If these do not help, procedures can be done to remove the hemorrhoids or make them smaller. These may involve:  Placing rubber bands at the base of the hemorrhoids to cut off their blood supply.  Injecting medicine into the hemorrhoids to shrink them.  Shining a type of light  energy onto the hemorrhoids to cause them to fall off.  Doing surgery to remove the hemorrhoids or cut off their blood supply. Follow these instructions at home: Eating and drinking   Eat foods that have a lot of fiber in them. These include whole grains, beans, nuts, fruits, and vegetables.  Ask your doctor about taking products that have added fiber (fibersupplements).  Reduce the amount of fat in your diet. You can do this by: ? Eating low-fat dairy products. ? Eating less red meat. ? Avoiding processed foods.  Drink enough fluid to keep your pee (urine) pale yellow. Managing pain and swelling   Take a warm-water bath (sitz bath) for 20 minutes to ease pain. Do this 3-4 times a day. You may do this in a bathtub or using a portable sitz bath that fits over the toilet.  If told, put ice on the painful area. It may be helpful to use ice between your warm baths. ? Put ice in a plastic bag. ? Place a towel between your skin and the bag. ? Leave the ice on for 20 minutes, 2-3 times a day. General instructions  Take over-the-counter and prescription medicines only as told by your doctor. ? Medicated creams and medicines may be used as told.  Exercise often. Ask your doctor how much and what kind of exercise is best for you.  Go to the bathroom when you have the urge to poop. Do not wait.  Avoid pushing too hard when you poop.  Keep your   butt dry and clean. Use wet toilet paper or moist towelettes after pooping.  Do not sit on the toilet for a long time.  Keep all follow-up visits as told by your doctor. This is important. Contact a doctor if you:  Have pain and swelling that do not get better with treatment or medicine.  Have trouble pooping.  Cannot poop.  Have pain or swelling outside the area of the hemorrhoids. Get help right away if you have:  Bleeding that will not stop. Summary  Hemorrhoids are swollen veins in the butt or around the opening of the  butt.  They can cause pain, itching, or bleeding.  Eat foods that have a lot of fiber in them. These include whole grains, beans, nuts, fruits, and vegetables.  Take a warm-water bath (sitz bath) for 20 minutes to ease pain. Do this 3-4 times a day. This information is not intended to replace advice given to you by your health care provider. Make sure you discuss any questions you have with your health care provider. Document Revised: 01/19/2018 Document Reviewed: 06/02/2017 Elsevier Patient Education  2020 Elsevier Inc.  Constipation, Adult Constipation is when a person:  Poops (has a bowel movement) fewer times in a week than normal.  Has a hard time pooping.  Has poop that is dry, hard, or bigger than normal. Follow these instructions at home: Eating and drinking   Eat foods that have a lot of fiber, such as: ? Fresh fruits and vegetables. ? Whole grains. ? Beans.  Eat less of foods that are high in fat, low in fiber, or overly processed, such as: ? French fries. ? Hamburgers. ? Cookies. ? Candy. ? Soda.  Drink enough fluid to keep your pee (urine) clear or pale yellow. General instructions  Exercise regularly or as told by your doctor.  Go to the restroom when you feel like you need to poop. Do not hold it in.  Take over-the-counter and prescription medicines only as told by your doctor. These include any fiber supplements.  Do pelvic floor retraining exercises, such as: ? Doing deep breathing while relaxing your lower belly (abdomen). ? Relaxing your pelvic floor while pooping.  Watch your condition for any changes.  Keep all follow-up visits as told by your doctor. This is important. Contact a doctor if:  You have pain that gets worse.  You have a fever.  You have not pooped for 4 days.  You throw up (vomit).  You are not hungry.  You lose weight.  You are bleeding from the anus.  You have thin, pencil-like poop (stool). Get help right away  if:  You have a fever, and your symptoms suddenly get worse.  You leak poop or have blood in your poop.  Your belly feels hard or bigger than normal (is bloated).  You have very bad belly pain.  You feel dizzy or you faint. This information is not intended to replace advice given to you by your health care provider. Make sure you discuss any questions you have with your health care provider. Document Revised: 12/24/2016 Document Reviewed: 07/02/2015 Elsevier Patient Education  2020 Elsevier Inc.  

## 2020-01-15 NOTE — MAU Provider Note (Signed)
History     CSN: [id number]  Arrival date and time: 01/15/20 1014   Event Date/Time   First Provider Initiated Contact with Patient 01/15/20 1219      Chief Complaint  Patient presents with  . Rectal Bleeding   HPI Patty Snyder is a 34 y.o. [id number] at [redacted]w[redacted]d who presents to [facility] via [facility] with chief complaint of bleeding. She is uncertain if it is vaginal or rectal bleeding. Patient endorses constipation, straining with bowel movements just before the onset of her two episodes of bleeding. She has not initiated treatments for constipation. She denies abdominal pain, dysuria, fever or recent illness.   She receives prenatal care with [facility].  OB History    Gravida  9   Para  4   Term  4   Preterm      AB  4   Living  4     SAB  1   IAB  3   Ectopic      Multiple      Live Births  4           Past Medical History:  Diagnosis Date  . Asthma     Past Surgical History:  Procedure Laterality Date  . OVARIAN CYST SURGERY      No family history on file.  Social History   Tobacco Use  . Smoking status: Never Smoker  . Smokeless tobacco: Never Used  Vaping Use  . Vaping Use: Never used  Substance Use Topics  . Alcohol use: Not Currently  . Drug use: Never    Allergies:  Allergies  Allergen Reactions  . Iodine Nausea And Vomiting  . Shellfish Allergy Nausea And Vomiting    Medications Prior to Admission  Medication Sig Dispense Refill Last Dose  . albuterol (PROVENTIL) (2.5 MG/3ML) 0.083% nebulizer solution Take 3 mLs by nebulization every 4 (four) hours as needed for wheezing.   01/14/2020 at Unknown time  . albuterol (VENTOLIN HFA) 108 (90 Base) MCG/ACT inhaler Inhale 1-2 puffs into the lungs every 4 (four) hours as needed for wheezing.   01/14/2020 at Unknown time  . aspirin EC 81 MG tablet Take 81 mg by mouth daily. Swallow whole.   Past Week at Unknown time  . Prenatal Vit-Fe Fumarate-FA (PRENATAL PO) Take 1 tablet by mouth daily.   Past  Month at Unknown time  . acetaminophen (TYLENOL) 325 MG tablet Take 325-650 mg by mouth as needed for mild pain or moderate pain.   More than a month at Unknown time  . Fluticasone-Salmeterol (ADVAIR) 250-50 MCG/DOSE AEPB Inhale 1 puff into the lungs daily.   More than a month at Unknown time    Review of Systems  Gastrointestinal: Positive for anal bleeding.  All other systems reviewed and are negative.  Physical Exam   Blood pressure (!) 108/46, pulse (!) 143, temperature 98.3 F (36.8 C), temperature source Oral, resp. rate 18, last menstrual period 06/03/2019, SpO2 100 %.  Physical Exam Vitals and nursing note reviewed. Exam conducted with a chaperone present.  Cardiovascular:     Rate and Rhythm: Normal rate.     Pulses: Normal pulses.     Heart sounds: Normal heart sounds.  Pulmonary:     Effort: Pulmonary effort is normal.     Breath sounds: Normal breath sounds.  Abdominal:     Comments: Gravid  Genitourinary:    Comments: Pelvic exam: External genitalia normal, vaginal walls pink and well rugated, cervix visually closed, no lesions  noted. Scant dried blood noted on buttocks near anus. No visible external hemorrhoid.  Skin:    Capillary Refill: Capillary refill takes less than 2 seconds.  Neurological:     Mental Status: She is alert and oriented to person, place, and time.  Psychiatric:        Mood and Affect: Mood normal.        Behavior: Behavior normal.        Thought Content: Thought content normal.        Judgment: Judgment normal.     [facility] Course  Procedures  --Reactive tracing: baseline 135, mod var, + 15 x 15 accels, no decels --Toco: occasional UI, otherwise quiet  Orders Placed This Encounter  Procedures  . Wet prep, genital  . [place] MFM OB Limited  . Urinalysis, Routine w reflex microscopic Urine, Clean Catch   Patient Vitals for the past 24 hrs:  BP Temp Temp src Pulse Resp SpO2  01/15/20 1230 122/71 -- -- 90 15 100 %  01/15/20 1134 (!) 108/46  -- -- (!) 143 -- --  01/15/20 1123 118/67 98.3 F (36.8 C) Oral 92 18 100 %  01/15/20 1020 132/79 99 F (37.2 C) Oral 98 16 100 %   Results for orders placed or performed during the hospital encounter of 01/15/20 (from the past 24 hour(s))  Urinalysis, Routine w reflex microscopic Urine, Clean Catch     Status: None   Collection Time: 01/15/20 11:26 AM  Result Value Ref Range   Color, Urine YELLOW YELLOW   APPearance CLEAR CLEAR   Specific Gravity, Urine 1.016 1.005 - 1.030   pH 8.0 5.0 - 8.0   Glucose, UA NEGATIVE NEGATIVE mg/dL   Hgb urine dipstick NEGATIVE NEGATIVE   Bilirubin Urine NEGATIVE NEGATIVE   Ketones, ur NEGATIVE NEGATIVE mg/dL   Protein, ur NEGATIVE NEGATIVE mg/dL   Nitrite NEGATIVE NEGATIVE   Leukocytes,Ua NEGATIVE NEGATIVE  Wet prep, genital     Status: Abnormal   Collection Time: 01/15/20 11:58 AM   Specimen: Vaginal  Result Value Ref Range   Yeast Wet Prep HPF POC NONE SEEN NONE SEEN   Trich, Wet Prep NONE SEEN NONE SEEN   Clue Cells Wet Prep HPF POC NONE SEEN NONE SEEN   WBC, Wet Prep HPF POC FEW (A) NONE SEEN   Sperm NONE SEEN    Meds ordered this encounter  Medications  . docusate sodium (COLACE) 100 MG capsule    Sig: Take 1 capsule (100 mg total) by mouth 2 (two) times daily as needed.    Dispense:  30 capsule    Refill:  2    Order Specific Question:   Supervising Provider    Answer:   ERVIN, MICHAEL L [[id number]]  . polyethylene glycol (MIRALAX) 17 g packet    Sig: Take 17 g by mouth daily.    Dispense:  14 each    Refill:  0    Order Specific Question:   Supervising Provider    Answer:   Hermina Staggers [[id number]]   Assessment and Plan  --34 y.o. [id number] at [redacted]w[redacted]d  --Reactive tracing, closed cervix --No concerning findings on MFM Limited --Hemorrhoid in setting of new onset constipation --New regimen for constipation --Discharge home in stable condition  Calvert Cantor, CNM 01/15/2020, 5:10 PM

## 2020-01-16 LAB — GC/CHLAMYDIA PROBE AMP (~~LOC~~) NOT AT ARMC
Chlamydia: NEGATIVE
Comment: NEGATIVE
Comment: NORMAL
Neisseria Gonorrhea: NEGATIVE

## 2020-01-26 DIAGNOSIS — Z419 Encounter for procedure for purposes other than remedying health state, unspecified: Secondary | ICD-10-CM | POA: Diagnosis not present

## 2020-01-26 NOTE — L&D Delivery Note (Addendum)
[facility]N [facility] Delivery Note  Patty Snyder is a 35 y.o. [id number] s/p SVD at [redacted]w[redacted]d. She was admitted for SOL.   ROM: 3h 42m with clear fluid GBS Status: Positive, did not receive adequate intrapartum prophylaxis Maximum Maternal Temperature: 98.1  Labor Progress: Presented with spontaneous onset of labor and was dilated to 8 cm.  Progressed to complete without complication or intervention.  Delivery Date/Time: 03/14/2019 at 2111 Delivery: Called to room and patient was complete and pushing. Head delivered LOA. No nuchal cord present. Shoulder and body delivered in usual fashion. Infant with spontaneous cry, placed on mother's abdomen, dried and stimulated. Cord clamped x 2 after 1-minute delay, and cut by patient under my direct supervision. Cord blood drawn. Placenta delivered spontaneously with gentle cord traction. Fundus firm with massage and Pitocin. Labia, perineum, vagina, and cervix were inspected, found to have a small left labial laceration.   Placenta: Intact, 3 vessel cord.  Of note, cord had a loose true knot. Complications: None.   Lacerations: Small left labial laceration that was hemostatic and did not require repair.   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   Gypsy Decant, MD 03/13/2020, 9:25 PM   The above was performed under my direct supervision and guidance.

## 2020-02-07 DIAGNOSIS — O368190 Decreased fetal movements, unspecified trimester, not applicable or unspecified: Secondary | ICD-10-CM | POA: Diagnosis not present

## 2020-02-07 DIAGNOSIS — Z6841 Body Mass Index (BMI) 40.0 and over, adult: Secondary | ICD-10-CM | POA: Diagnosis not present

## 2020-02-07 DIAGNOSIS — Z3A34 34 weeks gestation of pregnancy: Secondary | ICD-10-CM | POA: Diagnosis not present

## 2020-02-07 DIAGNOSIS — O99213 Obesity complicating pregnancy, third trimester: Secondary | ICD-10-CM | POA: Diagnosis not present

## 2020-02-14 DIAGNOSIS — Z6841 Body Mass Index (BMI) 40.0 and over, adult: Secondary | ICD-10-CM | POA: Diagnosis not present

## 2020-02-14 DIAGNOSIS — Z8759 Personal history of other complications of pregnancy, childbirth and the puerperium: Secondary | ICD-10-CM | POA: Diagnosis not present

## 2020-02-14 DIAGNOSIS — O368190 Decreased fetal movements, unspecified trimester, not applicable or unspecified: Secondary | ICD-10-CM | POA: Diagnosis not present

## 2020-02-21 DIAGNOSIS — Z6841 Body Mass Index (BMI) 40.0 and over, adult: Secondary | ICD-10-CM | POA: Diagnosis not present

## 2020-02-21 DIAGNOSIS — O09293 Supervision of pregnancy with other poor reproductive or obstetric history, third trimester: Secondary | ICD-10-CM | POA: Diagnosis not present

## 2020-02-21 DIAGNOSIS — Z3480 Encounter for supervision of other normal pregnancy, unspecified trimester: Secondary | ICD-10-CM | POA: Diagnosis not present

## 2020-02-21 DIAGNOSIS — O0993 Supervision of high risk pregnancy, unspecified, third trimester: Secondary | ICD-10-CM | POA: Diagnosis not present

## 2020-02-26 DIAGNOSIS — Z419 Encounter for procedure for purposes other than remedying health state, unspecified: Secondary | ICD-10-CM | POA: Diagnosis not present

## 2020-02-28 DIAGNOSIS — O0993 Supervision of high risk pregnancy, unspecified, third trimester: Secondary | ICD-10-CM | POA: Diagnosis not present

## 2020-02-28 DIAGNOSIS — Z8759 Personal history of other complications of pregnancy, childbirth and the puerperium: Secondary | ICD-10-CM | POA: Diagnosis not present

## 2020-02-28 DIAGNOSIS — Z6841 Body Mass Index (BMI) 40.0 and over, adult: Secondary | ICD-10-CM | POA: Diagnosis not present

## 2020-03-06 DIAGNOSIS — O09293 Supervision of pregnancy with other poor reproductive or obstetric history, third trimester: Secondary | ICD-10-CM | POA: Diagnosis not present

## 2020-03-06 DIAGNOSIS — Z6841 Body Mass Index (BMI) 40.0 and over, adult: Secondary | ICD-10-CM | POA: Diagnosis not present

## 2020-03-06 DIAGNOSIS — O0993 Supervision of high risk pregnancy, unspecified, third trimester: Secondary | ICD-10-CM | POA: Diagnosis not present

## 2020-03-13 ENCOUNTER — Inpatient Hospital Stay (HOSPITAL_COMMUNITY): Payer: Medicaid Other | Admitting: Anesthesiology

## 2020-03-13 ENCOUNTER — Inpatient Hospital Stay (HOSPITAL_COMMUNITY)
Admission: AD | Admit: 2020-03-13 | Discharge: 2020-03-15 | DRG: 807 | Disposition: A | Discharge status: Home / Self Care | Payer: Medicaid Other | Attending: Family Medicine | Admitting: Family Medicine

## 2020-03-13 ENCOUNTER — Encounter (HOSPITAL_COMMUNITY): Payer: Self-pay | Admitting: Family Medicine

## 2020-03-13 ENCOUNTER — Other Ambulatory Visit: Payer: Self-pay

## 2020-03-13 DIAGNOSIS — O99824 Streptococcus B carrier state complicating childbirth: Secondary | ICD-10-CM | POA: Diagnosis not present

## 2020-03-13 DIAGNOSIS — O09292 Supervision of pregnancy with other poor reproductive or obstetric history, second trimester: Secondary | ICD-10-CM | POA: Diagnosis not present

## 2020-03-13 DIAGNOSIS — J4520 Mild intermittent asthma, uncomplicated: Secondary | ICD-10-CM | POA: Diagnosis not present

## 2020-03-13 DIAGNOSIS — Z3A39 39 weeks gestation of pregnancy: Secondary | ICD-10-CM

## 2020-03-13 DIAGNOSIS — O9952 Diseases of the respiratory system complicating childbirth: Secondary | ICD-10-CM | POA: Diagnosis present

## 2020-03-13 DIAGNOSIS — Z8759 Personal history of other complications of pregnancy, childbirth and the puerperium: Secondary | ICD-10-CM | POA: Diagnosis not present

## 2020-03-13 DIAGNOSIS — O26893 Other specified pregnancy related conditions, third trimester: Secondary | ICD-10-CM | POA: Diagnosis not present

## 2020-03-13 DIAGNOSIS — Z20822 Contact with and (suspected) exposure to covid-19: Secondary | ICD-10-CM | POA: Diagnosis present

## 2020-03-13 DIAGNOSIS — Z369 Encounter for antenatal screening, unspecified: Secondary | ICD-10-CM | POA: Diagnosis not present

## 2020-03-13 DIAGNOSIS — Z3480 Encounter for supervision of other normal pregnancy, unspecified trimester: Secondary | ICD-10-CM | POA: Diagnosis not present

## 2020-03-13 DIAGNOSIS — O209 Hemorrhage in early pregnancy, unspecified: Secondary | ICD-10-CM

## 2020-03-13 DIAGNOSIS — Z9889 Other specified postprocedural states: Secondary | ICD-10-CM | POA: Diagnosis not present

## 2020-03-13 DIAGNOSIS — J45909 Unspecified asthma, uncomplicated: Secondary | ICD-10-CM | POA: Diagnosis not present

## 2020-03-13 LAB — CBC
HCT: 41 % (ref 36.0–46.0)
Hemoglobin: 13.6 g/dL (ref 12.0–15.0)
MCH: 30.5 pg (ref 26.0–34.0)
MCHC: 33.2 g/dL (ref 30.0–36.0)
MCV: 91.9 fL (ref 80.0–100.0)
Platelets: 182 10*3/uL (ref 150–400)
RBC: 4.46 MIL/uL (ref 3.87–5.11)
RDW: 13.8 % (ref 11.5–15.5)
WBC: 10.7 10*3/uL — ABNORMAL HIGH (ref 4.0–10.5)
nRBC: 0 % (ref 0.0–0.2)

## 2020-03-13 LAB — RESP PANEL BY RT-PCR (FLU A&B, COVID) ARPGX2
Influenza A by PCR: NEGATIVE
Influenza B by PCR: NEGATIVE
SARS Coronavirus 2 by RT PCR: NEGATIVE

## 2020-03-13 LAB — TYPE AND SCREEN
ABO/RH(D): O POS
Antibody Screen: NEGATIVE

## 2020-03-13 MED ORDER — FENTANYL-BUPIVACAINE-NACL 0.5-0.125-0.9 MG/250ML-% EP SOLN
12.0000 mL/h | EPIDURAL | Status: DC | PRN
  Administered 2020-03-13: 12 mL/h via EPIDURAL
  Filled 2020-03-13: qty 250

## 2020-03-13 MED ORDER — SODIUM CHLORIDE 0.9 % IV SOLN
INTRAVENOUS | Status: AC
  Filled 2020-03-13: qty 2000

## 2020-03-13 MED ORDER — PHENYLEPHRINE 40 MCG/ML (10ML) SYRINGE FOR IV PUSH (FOR BLOOD PRESSURE SUPPORT): 80.0000 ug | PREFILLED_SYRINGE | INTRAVENOUS | Status: DC | PRN

## 2020-03-13 MED ORDER — EPHEDRINE 5 MG/ML INJ: 10.0000 mg | INTRAVENOUS | Status: DC | PRN

## 2020-03-13 MED ORDER — FLEET ENEMA 7-19 GM/118ML RE ENEM: 1.0000 | ENEMA | RECTAL | Status: DC | PRN

## 2020-03-13 MED ORDER — ACETAMINOPHEN 325 MG PO TABS: 650.0000 mg | ORAL_TABLET | ORAL | Status: DC | PRN

## 2020-03-13 MED ORDER — HYDROXYZINE HCL 50 MG PO TABS: 50.0000 mg | ORAL_TABLET | Freq: Four times a day (QID) | ORAL | Status: DC | PRN

## 2020-03-13 MED ORDER — SODIUM CHLORIDE 0.9 % IV SOLN: 1.0000 g | INTRAVENOUS | Status: DC

## 2020-03-13 MED ORDER — LACTATED RINGERS IV SOLN
500.0000 mL | Freq: Once | INTRAVENOUS | Status: AC
  Administered 2020-03-13: 500 mL via INTRAVENOUS

## 2020-03-13 MED ORDER — SODIUM CHLORIDE 0.9 % IV SOLN
2.0000 g | Freq: Once | INTRAVENOUS | Status: AC
  Administered 2020-03-13: 2 g via INTRAVENOUS

## 2020-03-13 MED ORDER — OXYTOCIN BOLUS FROM INFUSION
333.0000 mL | Freq: Once | INTRAVENOUS | Status: AC
  Administered 2020-03-13: 333 mL via INTRAVENOUS

## 2020-03-13 MED ORDER — SOD CITRATE-CITRIC ACID 500-334 MG/5ML PO SOLN: 30.0000 mL | ORAL | Status: DC | PRN

## 2020-03-13 MED ORDER — LIDOCAINE HCL (PF) 1 % IJ SOLN
INTRAMUSCULAR | Status: DC | PRN
  Administered 2020-03-13: 10 mL via EPIDURAL

## 2020-03-13 MED ORDER — LACTATED RINGERS IV SOLN: 500.0000 mL | INTRAVENOUS | Status: DC | PRN

## 2020-03-13 MED ORDER — DIPHENHYDRAMINE HCL 50 MG/ML IJ SOLN: 12.5000 mg | INTRAMUSCULAR | Status: DC | PRN

## 2020-03-13 MED ORDER — LIDOCAINE HCL (PF) 1 % IJ SOLN: 30.0000 mL | INTRAMUSCULAR | Status: DC | PRN

## 2020-03-13 MED ORDER — OXYCODONE-ACETAMINOPHEN 5-325 MG PO TABS: 1.0000 | ORAL_TABLET | ORAL | Status: DC | PRN

## 2020-03-13 MED ORDER — LACTATED RINGERS IV SOLN: INTRAVENOUS | Status: DC

## 2020-03-13 MED ORDER — FENTANYL CITRATE (PF) 100 MCG/2ML IJ SOLN: 50.0000 ug | INTRAMUSCULAR | Status: DC | PRN

## 2020-03-13 MED ORDER — OXYTOCIN-SODIUM CHLORIDE 30-0.9 UT/500ML-% IV SOLN
2.5000 [IU]/h | INTRAVENOUS | Status: DC
  Administered 2020-03-13: 2.5 [IU]/h via INTRAVENOUS
  Filled 2020-03-13: qty 500

## 2020-03-13 MED ORDER — ONDANSETRON HCL 4 MG/2ML IJ SOLN: 4.0000 mg | Freq: Four times a day (QID) | INTRAMUSCULAR | Status: DC | PRN

## 2020-03-13 MED ORDER — OXYCODONE-ACETAMINOPHEN 5-325 MG PO TABS: 2.0000 | ORAL_TABLET | ORAL | Status: DC | PRN

## 2020-03-13 NOTE — Anesthesia Procedure Notes (Signed)
Epidural Patient location during procedure: OB Start time: 03/13/2020 7:35 PM End time: 03/13/2020 7:38 PM  Staffing Anesthesiologist: Leilani Able, MD Performed: anesthesiologist   Preanesthetic Checklist Completed: patient identified, IV checked, site marked, risks and benefits discussed, surgical consent, monitors and equipment checked, pre-op evaluation and timeout performed  Epidural Patient position: sitting Prep: DuraPrep and site prepped and draped Patient monitoring: continuous pulse ox and blood pressure Approach: midline Location: L3-L4 Injection technique: LOR air  Needle:  Needle type: Tuohy  Needle gauge: 17 G Needle length: 9 cm and 9 Needle insertion depth: 8 cm Catheter type: closed end flexible Catheter size: 19 Gauge Catheter at skin depth: 14 cm Test dose: negative and Other  Assessment Events: blood not aspirated, injection not painful, no injection resistance, no paresthesia and negative IV test  Additional Notes Reason for block:procedure for pain

## 2020-03-13 NOTE — H&P (Addendum)
OBSTETRIC ADMISSION HISTORY AND PHYSICAL  Patty Snyder is a 35 y.o. female [phone] with IUP at 19w3dby LMP presenting for active labor. She reports +FMs, No LOF, no VB, no blurry vision, headaches or peripheral edema, and RUQ pain.  She plans on breast feeding. She is undecided for birth control. [facility] [place] [facility] [place] [facility] --->  Estimated Date of Delivery: 03/17/20  Sono:   _0 , CWD, normal anatomy, vertex presentation, posterior placenta   Prenatal History/Complications: GBS+, rubella NI, HSV2, BMI 49 with excessive weight gain in pregnancy (57 lbs), asthma, h/o gHTN in prior pregnancy, abnormal pap (ASCUS/+HPV)  Past Medical History: Past Medical History:  Diagnosis Date  . Asthma    last used inhaler last night    Past Surgical History: Past Surgical History:  Procedure Laterality Date  . OVARIAN CYST SURGERY      Obstetrical History: OB History    Gravida  9   Para  4   Term  4   Preterm      AB  4   Living  4     SAB  1   IAB  3   Ectopic      Multiple      Live Births  4           Social History Social History   Socioeconomic History  . Marital status: Married    Spouse name: Not on file  . Number of children: Not on file  . Years of education: Not on file  . Highest education level: Not on file  Occupational History  . Not on file  Tobacco Use  . Smoking status: Never Smoker  . Smokeless tobacco: Never Used  Vaping Use  . Vaping Use: Never used  Substance and Sexual Activity  . Alcohol use: Not Currently  . Drug use: Never  . Sexual activity: Yes    Birth control/protection: None  Other Topics Concern  . Not on file  Social History Narrative  . Not on file   Social Determinants of Health   Financial Resource Strain: Not on file  Food Insecurity: Not on file  Transportation Needs: Not on file  Physical Activity: Not on file  Stress: Not on file  Social Connections: Not on file     Family History: History reviewed. No pertinent family history.  Allergies: Allergies  Allergen Reactions  . Iodine Nausea And Vomiting  . Shellfish Allergy Nausea And Vomiting    Medications Prior to Admission  Medication Sig Dispense Refill Last Dose  . acetaminophen (TYLENOL) 325 MG tablet Take 325-650 mg by mouth as needed for mild pain or moderate pain.     .Marland Kitchenalbuterol (PROVENTIL) (2.5 MG/3ML) 0.083% nebulizer solution Take 3 mLs by nebulization every 4 (four) hours as needed for wheezing.     .Marland Kitchenalbuterol (VENTOLIN HFA) 108 (90 Base) MCG/ACT inhaler Inhale 1-2 puffs into the lungs every 4 (four) hours as needed for wheezing.     .Marland Kitchenaspirin EC 81 MG tablet Take 81 mg by mouth daily. Swallow whole.     . docusate sodium (COLACE) 100 MG capsule Take 1 capsule (100 mg total) by mouth 2 (two) times daily as needed. 30 capsule 2   . Fluticasone-Salmeterol (ADVAIR) 250-50 MCG/DOSE AEPB Inhale 1 puff into the lungs daily.     . polyethylene glycol (MIRALAX) 17 g packet Take 17 g by mouth daily. 14 each 0   . Prenatal Vit-Fe Fumarate-FA (  PRENATAL PO) Take 1 tablet by mouth daily.        Review of Systems   All systems reviewed and negative except as stated in HPI  Blood pressure 122/61, pulse 84, temperature 98.1 F (36.7 C), temperature source Oral, height 5' 10" (1.778 m), weight (!) 165.1 kg, last menstrual period 06/03/2019, SpO2 99 %. General appearance: alert and cooperative Lungs: clear to auscultation bilaterally Heart: regular rate and rhythm Abdomen: soft, non-tender; bowel sounds normal Extremities: Homans sign is negative, no sign of DVT Presentation: cephalic Fetal monitoringBaseline: 115 bpm, Variability: Good {> 6 bpm) and Accelerations: Reactive Uterine activityFrequency: Every 4 minutes Dilation: 10 Effacement (%): 100 Station: Plus 1 Exam by:: Casey Ellsworth RN, Casey Barneycastle RN   Prenatal labs: ABO, Rh: --/--/O POS (02/17 1850) Antibody: NEG  (02/17 1850) Rubella:  Non-immune RPR:   Non-reactive HBsAg:   Negative HIV:   Non-reactive GBS:   Positive 1 hr Glucola 81 (normal) Genetic screening  declined  Prenatal Transfer Tool  Maternal Diabetes: No Genetic Screening: Declined Maternal Ultrasounds/Referrals: Normal Fetal Ultrasounds or other Referrals:  Normal NST Maternal Substance Abuse:  No Significant Maternal Medications:  None Significant Maternal Lab Results: Group B Strep positive  Results for orders placed or performed during the hospital encounter of 03/13/20 (from the past 24 hour(s))  Type and screen [facility]   Collection Time: 03/13/20  6:50 PM  Result Value Ref Range   ABO/RH(D) O POS    Antibody Screen NEG    Sample Expiration      03/16/2020,2359 Performed at [facility], [address] [place], [place] [address]   Resp Panel by RT-PCR (Flu A&B, Covid) Nasopharyngeal Swab   Collection Time: 03/13/20  6:53 PM   Specimen: Nasopharyngeal Swab; Nasopharyngeal(NP) swabs in vial transport medium  Result Value Ref Range   SARS Coronavirus 2 by RT PCR NEGATIVE NEGATIVE   Influenza A by PCR NEGATIVE NEGATIVE   Influenza B by PCR NEGATIVE NEGATIVE  CBC   Collection Time: 03/13/20  6:53 PM  Result Value Ref Range   WBC 10.7 (H) 4.0 - 10.5 K/uL   RBC 4.46 3.87 - 5.11 MIL/uL   Hemoglobin 13.6 12.0 - 15.0 g/dL   HCT 41.0 36.0 - 46.0 %   MCV 91.9 80.0 - 100.0 fL   MCH 30.5 26.0 - 34.0 pg   MCHC 33.2 30.0 - 36.0 g/dL   RDW 13.8 11.5 - 15.5 %   Platelets 182 150 - 400 K/uL   nRBC 0.0 0.0 - 0.2 %    Patient Active Problem List   Diagnosis Date Noted  . Indication for care in labor or delivery 03/13/2020  . Bleeding in early pregnancy 08/09/2019  . History of gestational diabetes 03/30/2019  . History of pregnancy induced hypertension 03/30/2019  . Mild intermittent asthma without complication 07/15/2016  . 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  . Rubella non-immune status,  antepartum 09/11/2015    Assessment/Plan:  Patty Snyder is a 34 y.o. G9P4044 at [redacted]w[redacted]d here for active labor.  #Labor: grand multip, currently 8/70, continue routine care #Pain: Prn, desires epidural if able #FWB: Cat 1 #ID:  +GBS> amp,  rubella NI > needs MMR postpartum #MOF: Breast #MOC: undecided #Circ:  Yes #HSV2: no active lesions #abnormal pap: needs repeat postpartum     Frances Cresenzo-Dishmon, CNM  03/13/2020, 9:26 PM    

## 2020-03-13 NOTE — Plan of Care (Signed)

## 2020-03-13 NOTE — MAU Note (Signed)
Pt arrived to MAU in active labor. FHR 120 cervical exam 8/70/ vertex. Covid swab obtained. Expedited transfer to L&D. Dr. Crissie Reese at bedside for patient transport.

## 2020-03-13 NOTE — Anesthesia Preprocedure Evaluation (Signed)
Anesthesia Evaluation  Patient identified by MRN, date of birth, ID band Patient awake    Reviewed: Allergy & Precautions, H&P , Patient's Chart, lab work & pertinent test results  Airway Mallampati: III  TM Distance: >3 FB Neck ROM: full    Dental no notable dental hx.    Pulmonary asthma ,    Pulmonary exam normal        Cardiovascular negative cardio ROS Normal cardiovascular exam     Neuro/Psych negative neurological ROS  negative psych ROS   GI/Hepatic negative GI ROS, Neg liver ROS,   Endo/Other  Morbid obesity  Renal/GU negative Renal ROS  negative genitourinary   Musculoskeletal negative musculoskeletal ROS (+)   Abdominal (+) + obese,   Peds  Hematology negative hematology ROS (+)   Anesthesia Other Findings   Reproductive/Obstetrics (+) Pregnancy                             Anesthesia Physical Anesthesia Plan  ASA: III  Anesthesia Plan: Epidural   Post-op Pain Management:    Induction:   PONV Risk Score and Plan:   Airway Management Planned:   Additional Equipment:   Intra-op Plan:   Post-operative Plan:   Informed Consent: I have reviewed the patients History and Physical, chart, labs and discussed the procedure including the risks, benefits and alternatives for the proposed anesthesia with the patient or authorized representative who has indicated his/her understanding and acceptance.       Plan Discussed with:   Anesthesia Plan Comments:         Anesthesia Quick Evaluation

## 2020-03-13 NOTE — Discharge Summary (Signed)
Postpartum Discharge Summary     Patient Name: Patty Snyder DOB: 21-Oct-1985 MRN: [id number]  Date of admission: 03/13/2020 Delivery date:03/13/2020  Delivering provider: Christin Fudge  Date of discharge: 03/15/2020  Admitting diagnosis: Indication for care in labor or delivery [O75.9] Intrauterine pregnancy: [redacted]w[redacted]d    Secondary diagnosis:  Active Problems:   Indication for care in labor or delivery  Additional problems: none    Discharge diagnosis: Term Pregnancy Delivered                                              Post partum procedures:none Augmentation: none Complications: None  Hospital course: Onset of Labor With Vaginal Delivery      35y.o. yo [id number] 338w3das admitted in Active Labor on 03/13/2020. Patient had an uncomplicated labor course as follows:  Membrane Rupture Time/Date: 6:00 PM ,03/13/2020   Delivery Method:Vaginal, Spontaneous  Episiotomy: None  Lacerations:  Labial  Patient had an uncomplicated postpartum course. She was started on norvasc [id number] is ambulating, tolerating a regular diet, passing flatus, and urinating well. Patient is discharged home in stable condition on 03/15/20.  Newborn Data: Birth date:03/13/2020  Birth time:9:11 PM  Gender:Female  Living status:Living  Apgars:9 ,9  Weight:3476 g   Magnesium Sulfate received: No BMZ received: No Rhophylac:N/A MMR:N/A T-DaP:Given prenatally Flu: Yes Transfusion:No  Physical exam  Vitals:   03/14/20 1407 03/14/20 1535 03/14/20 2004 03/15/20 0531  BP: (!) 135/99 119/81 116/61 130/86  Pulse: 87 93 84 85  Resp: _0 Temp: 98.4 F (36.9 C)  98.2 F (36.8 C) 97.6 F (36.4 C)  TempSrc: Oral  Oral Axillary  SpO2:  99% 97% 98%  Weight:      Height:       General: alert, cooperative and no distress Lochia: appropriate Uterine Fundus: firm Incision: N/A DVT Evaluation: No evidence of DVT seen on physical exam. Labs: Lab Results  Component Value Date   WBC 10.7  (H) 03/13/2020   HGB 13.6 03/13/2020   HCT 41.0 03/13/2020   MCV 91.9 03/13/2020   PLT 182 03/13/2020   CMP Latest Ref Rng & Units 08/09/2019  Glucose 70 - 99 mg/dL 99  BUN 6 - 20 mg/dL 10  Creatinine 0.44 - 1.00 mg/dL 0.45  Sodium 135 - 145 mmol/L 135  Potassium 3.5 - 5.1 mmol/L 3.6  Chloride 98 - 111 mmol/L 106  CO2 22 - 32 mmol/L 20(L)  Calcium 8.9 - 10.3 mg/dL 8.9  Total Protein 6.5 - 8.1 g/dL 6.0(L)  Total Bilirubin 0.3 - 1.2 mg/dL 0.5  Alkaline Phos 38 - 126 U/L 35(L)  AST 15 - 41 U/L 14(L)  ALT 0 - 44 U/L 11   Edinburgh Score: Edinburgh Postnatal Depression Scale Screening Tool 03/14/2020  I have been able to laugh and see the funny side of things. 0  I have looked forward with enjoyment to things. 0  I have blamed myself unnecessarily when things went wrong. 0  I have been anxious or worried for no good reason. 0  I have felt scared or panicky for no good reason. 0  Things have been getting on top of me. 0  I have been so unhappy that I have had difficulty sleeping. 0  I have felt sad or miserable. 0  I have been so unhappy that I  have been crying. 0  The thought of harming myself has occurred to me. 0  Edinburgh Postnatal Depression Scale Total 0     After visit meds:  Allergies as of 03/15/2020      Reactions   Iodine Nausea And Vomiting   Shellfish Allergy Nausea And Vomiting      Medication List    STOP taking these medications   aspirin EC 81 MG tablet   valACYclovir 500 MG tablet Commonly known as: VALTREX     TAKE these medications   acetaminophen 325 MG tablet Commonly known as: Tylenol Take 2 tablets (650 mg total) by mouth every 4 (four) hours.   albuterol (2.5 MG/3ML) 0.083% nebulizer solution Commonly known as: PROVENTIL Take 3 mLs by nebulization every 4 (four) hours as needed for wheezing.   albuterol 108 (90 Base) MCG/ACT inhaler Commonly known as: VENTOLIN HFA Inhale 1-2 puffs into the lungs every 4 (four) hours as needed for  wheezing.   amLODipine 5 MG tablet Commonly known as: NORVASC Take 1 tablet (5 mg total) by mouth daily.   calcium carbonate 500 MG chewable tablet Commonly known as: TUMS - dosed in mg elemental calcium Chew 2 tablets by mouth daily as needed for indigestion or heartburn.   docusate sodium 100 MG capsule Commonly known as: COLACE Take 1 capsule (100 mg total) by mouth 2 (two) times daily as needed.   Fluticasone-Salmeterol 250-50 MCG/DOSE Aepb Commonly known as: ADVAIR Inhale 1 puff into the lungs daily.   ibuprofen 600 MG tablet Commonly known as: ADVIL Take 1 tablet (600 mg total) by mouth every 6 (six) hours.   polyethylene glycol 17 g packet Commonly known as: MiraLax Take 17 g by mouth daily.   prenatal multivitamin Tabs tablet Take 1 tablet by mouth daily at 12 noon.        Discharge home in stable condition Infant Feeding: Bottle and Breast Infant Disposition:home with mother Discharge instruction: per After Visit Summary and Postpartum booklet. Activity: Advance as tolerated. Pelvic rest for 6 weeks.  Diet: routine diet Future Appointments:No future appointments. counseled to make follow up appointment with [facility].    03/15/2020 Janet Berlin, MD

## 2020-03-14 DIAGNOSIS — Z9889 Other specified postprocedural states: Secondary | ICD-10-CM

## 2020-03-14 LAB — RPR: RPR Ser Ql: NONREACTIVE

## 2020-03-14 MED ORDER — WITCH HAZEL-GLYCERIN EX PADS: 1.0000 "application " | MEDICATED_PAD | CUTANEOUS | Status: DC | PRN

## 2020-03-14 MED ORDER — ZOLPIDEM TARTRATE 5 MG PO TABS: 5.0000 mg | ORAL_TABLET | Freq: Every evening | ORAL | Status: DC | PRN

## 2020-03-14 MED ORDER — COCONUT OIL OIL: 1.0000 "application " | TOPICAL_OIL | Status: DC | PRN

## 2020-03-14 MED ORDER — DIPHENHYDRAMINE HCL 25 MG PO CAPS: 25.0000 mg | ORAL_CAPSULE | Freq: Four times a day (QID) | ORAL | Status: DC | PRN

## 2020-03-14 MED ORDER — TETANUS-DIPHTH-ACELL PERTUSSIS 5-2.5-18.5 LF-MCG/0.5 IM SUSY: 0.5000 mL | PREFILLED_SYRINGE | Freq: Once | INTRAMUSCULAR | Status: DC

## 2020-03-14 MED ORDER — PRENATAL MULTIVITAMIN CH
1.0000 | ORAL_TABLET | Freq: Every day | ORAL | Status: DC
  Administered 2020-03-14 – 2020-03-15 (×2): 1 via ORAL
  Filled 2020-03-14 (×2): qty 1

## 2020-03-14 MED ORDER — ONDANSETRON HCL 4 MG PO TABS: 4.0000 mg | ORAL_TABLET | ORAL | Status: DC | PRN

## 2020-03-14 MED ORDER — DIBUCAINE (PERIANAL) 1 % EX OINT: 1.0000 "application " | TOPICAL_OINTMENT | CUTANEOUS | Status: DC | PRN

## 2020-03-14 MED ORDER — ONDANSETRON HCL 4 MG/2ML IJ SOLN: 4.0000 mg | INTRAMUSCULAR | Status: DC | PRN

## 2020-03-14 MED ORDER — SIMETHICONE 80 MG PO CHEW: 80.0000 mg | CHEWABLE_TABLET | ORAL | Status: DC | PRN

## 2020-03-14 MED ORDER — IBUPROFEN 600 MG PO TABS
600.0000 mg | ORAL_TABLET | Freq: Four times a day (QID) | ORAL | Status: DC
  Administered 2020-03-14 – 2020-03-15 (×6): 600 mg via ORAL
  Filled 2020-03-14 (×6): qty 1

## 2020-03-14 MED ORDER — BENZOCAINE-MENTHOL 20-0.5 % EX AERO: 1.0000 "application " | INHALATION_SPRAY | CUTANEOUS | Status: DC | PRN

## 2020-03-14 MED ORDER — ACETAMINOPHEN 325 MG PO TABS
650.0000 mg | ORAL_TABLET | ORAL | Status: DC
  Administered 2020-03-14 – 2020-03-15 (×9): 650 mg via ORAL
  Filled 2020-03-14 (×9): qty 2

## 2020-03-14 MED ORDER — SENNOSIDES-DOCUSATE SODIUM 8.6-50 MG PO TABS
2.0000 | ORAL_TABLET | Freq: Every day | ORAL | Status: DC
  Administered 2020-03-14 – 2020-03-15 (×2): 2 via ORAL
  Filled 2020-03-14 (×2): qty 2

## 2020-03-14 NOTE — Anesthesia Postprocedure Evaluation (Signed)
Anesthesia Post Note  Patient: Patty Snyder  Procedure(s) Performed: AN AD HOC LABOR EPIDURAL     Patient location during evaluation: Mother Baby Anesthesia Type: Epidural Level of consciousness: awake Pain management: satisfactory to patient Vital Signs Assessment: post-procedure vital signs reviewed and stable Respiratory status: spontaneous breathing Cardiovascular status: stable Anesthetic complications: no   No complications documented.  Last Vitals:  Vitals:   03/14/20 0030 03/14/20 0430  BP: 129/73 110/64  Pulse: 91 95  Resp: 17 18  Temp: 36.7 C 36.7 C  SpO2: 97% 99%    Last Pain:  Vitals:   03/14/20 0430  TempSrc: Oral  PainSc: 3    Pain Goal:                   [employer]

## 2020-03-14 NOTE — Progress Notes (Signed)
Pt states she felt "just a little bit nauseous after eating my lunch.  I literally just ate it."  BP=135/99 , HR=85  She denies HA, blurry/spotted vision.  She is using the DEBP and has infant STS.  She will call RN when she has finished and after being more relaxed in bed for a repeat BP.  Will continue to monitor.

## 2020-03-14 NOTE — Progress Notes (Addendum)
POSTPARTUM PROGRESS NOTE  Subjective: Patty Snyder is a 35 y.o. W7P7106 s/p SVD at [redacted]w[redacted]d  She reports she doing well. No acute events overnight. She denies any problems with ambulating, voiding or po intake. Denies nausea or vomiting. Pain is well controlled.  Lochia is less than menses.  Objective: Blood pressure 110/64, pulse 95, temperature 98 F (36.7 C), temperature source Oral, resp. rate 18, height '5\' 10"'  (1.778 m), weight (!) 165.1 kg, last menstrual period 06/03/2019, SpO2 99 %, unknown if currently breastfeeding.  Physical Exam:  General: alert, cooperative and no distress Chest: no respiratory distress Abdomen: soft, non-tender  Uterine Fundus: firm, appropriately tender Extremities: No calf swelling or tenderness  no edema  Recent Labs    03/13/20 1853  HGB 13.6  HCT 41.0    Assessment/Plan: Patty Snyder a 35y.o. [id number]/p SVD at 317w3dfter presenting in active labor.  Routine Postpartum Care: Doing well, pain well-controlled.  -Continue routine care, lactation support  -Contraception: Unsure, wants to decide at postpartum visit -Feeding: Breast  Dispo: Plan for discharge 2/19.  Patty SciaraMD 03/14/2020, 7:54 AM  I personally saw and evaluated the patient, performing the key elements of the service. I developed and verified the management plan that is described in the resident's/student's note, and I agree with the content with my edits above. VSS, HRR&R, Resp unlabored, Legs neg.  Patty BertholdCNM 03/14/2020 1:02 PM

## 2020-03-14 NOTE — Lactation Note (Signed)
This note was copied from a baby's chart. Lactation Consultation Note  Patient Name: Boy Megha Agnes [id number] Date: 03/14/2020 Reason for consult: Initial assessment Age:35 hours  Mom is a P5 who formula fed her 1st 3 children. She pumped & BO her 4th child for 3 months. When I inquired about her milk supply, she replied she had "more than enough." Mom reports that her milk came to volume by the end of the 1st day with her 4th child.  Mom plans to pump & BO only with "Budd Palmer."  Mom has been using size 27 flanges; I recommended size 24 based off of her nipple diameter. I showed Mom drawings in [place] handbook of how we want her nipple to look when pumping; she agreed that she should try size 24.   Hand expression was taught to Mom; she may need review.   Mom recently pumped almost 7 mL of colostrum. Infant was not showing feeding cues. I attempted to wake "Budd Palmer," and attempted to entice him with a gloved finger with Mom's EBM, but he gagged and did not awaken. Infant was placed skin-to-skin on Mom.   I encouraged Mom to pump q2-3hrs, following her pumping session with hand expression. Mom knows how to wash her pump parts.   Lurline Hare [facility] 03/14/2020, 9:10 AM

## 2020-03-15 DIAGNOSIS — Z9889 Other specified postprocedural states: Secondary | ICD-10-CM | POA: Diagnosis not present

## 2020-03-15 MED ORDER — IBUPROFEN 600 MG PO TABS: 600.0000 mg | ORAL_TABLET | Freq: Four times a day (QID) | ORAL | 0 refills | Status: DC

## 2020-03-15 MED ORDER — AMLODIPINE BESYLATE 5 MG PO TABS: 5.0000 mg | ORAL_TABLET | Freq: Every day | ORAL | 0 refills | Status: DC

## 2020-03-15 MED ORDER — ACETAMINOPHEN 325 MG PO TABS: 650.0000 mg | ORAL_TABLET | ORAL | 0 refills | Status: DC

## 2020-03-15 NOTE — Lactation Note (Signed)
This note was copied from a baby's chart. Lactation Consultation Note  Patient Name: Patty Snyder [id number] Date: 03/15/2020 Reason for consult: Follow-up assessment Age:35 hours   P5, Baby out of room for circumcision.  Mother has personal DEBP at home.  Feeding Mother's Current Feeding Choice: Breast Milk and Formula Nipple Type: Extra Slow Flow  Lactation Tools Discussed/Used Tools: Pump Breast pump type: Double-Electric Breast Pump Pump Education: Milk Storage Reason for Pumping: Mother's choice  Interventions Interventions: Education  Discharge Discharge Education: Engorgement and breast care Pump: DEBP;Personal  Consult Status Consult Status: Complete    Hardie Pulley 03/15/2020, 11:01 AM

## 2020-03-25 DIAGNOSIS — Z419 Encounter for procedure for purposes other than remedying health state, unspecified: Secondary | ICD-10-CM | POA: Diagnosis not present

## 2020-04-21 ENCOUNTER — Other Ambulatory Visit: Payer: Self-pay | Admitting: Obstetrics and Gynecology

## 2020-04-25 DIAGNOSIS — Z419 Encounter for procedure for purposes other than remedying health state, unspecified: Secondary | ICD-10-CM | POA: Diagnosis not present

## 2020-04-29 DIAGNOSIS — H538 Other visual disturbances: Secondary | ICD-10-CM | POA: Diagnosis not present

## 2020-05-25 DIAGNOSIS — Z419 Encounter for procedure for purposes other than remedying health state, unspecified: Secondary | ICD-10-CM | POA: Diagnosis not present

## 2020-06-25 DIAGNOSIS — Z419 Encounter for procedure for purposes other than remedying health state, unspecified: Secondary | ICD-10-CM | POA: Diagnosis not present

## 2020-07-25 DIAGNOSIS — Z419 Encounter for procedure for purposes other than remedying health state, unspecified: Secondary | ICD-10-CM | POA: Diagnosis not present

## 2020-08-04 ENCOUNTER — Other Ambulatory Visit: Payer: Self-pay

## 2020-08-04 ENCOUNTER — Ambulatory Visit (INDEPENDENT_AMBULATORY_CARE_PROVIDER_SITE_OTHER): Payer: Medicaid Other

## 2020-08-04 VITALS — BP 131/67 | HR 80 | Ht 72.0 in | Wt 332.0 lb

## 2020-08-04 DIAGNOSIS — Z3480 Encounter for supervision of other normal pregnancy, unspecified trimester: Secondary | ICD-10-CM | POA: Diagnosis not present

## 2020-08-04 DIAGNOSIS — Z3201 Encounter for pregnancy test, result positive: Secondary | ICD-10-CM | POA: Diagnosis not present

## 2020-08-04 LAB — POCT PREGNANCY, URINE: Preg Test, Ur: POSITIVE — AB

## 2020-08-04 MED ORDER — BLOOD PRESSURE KIT DEVI: 1.0000 | 0 refills | Status: DC

## 2020-08-04 MED ORDER — PREPLUS 27-1 MG PO TABS: 1.0000 | ORAL_TABLET | Freq: Every day | ORAL | 8 refills | Status: DC

## 2020-08-04 NOTE — Progress Notes (Signed)
Pt here today for UPT. UPT in office is positive. Pt states took 2 UPTs at home that were positive. Pt has delivered with [facility] in 2/22, pt is not new pt to [facility]. Pt will [facility] at [facility]. Had previous care and vaginal births with [facility].   G9P5  LMP 06/25/20 EDD 04/01/21 [redacted]w[redacted]d  Pt denies having vaginal bleeding or cramps/abd pain at this time. Pt states having slight nausea, but not vomiting. Pt given safe medication list.   Pt advised to start taking PNV and eat healthy meals throughout the day. PNV Rx sent to pharmacy per pt's request.   Pt advised to make new OB intake and NEW OB appt at front office today. Pt agreeable and verbalized understanding to plan of care.  Judeth Cornfield, RN

## 2020-08-04 NOTE — Patient Instructions (Signed)
Prenatal Care Providers           [facility]  [address] [phone]  Safe Medications in Pregnancy   Acne:  Benzoyl Peroxide  Salicylic Acid   Backache/Headache:  Tylenol: 2 regular strength every 4 hours OR               2 Extra strength every 6 hours   Colds/Coughs/Allergies:  Benadryl (alcohol free) 25 mg every 6 hours as needed  Breath right strips  Claritin  Cepacol throat lozenges  Chloraseptic throat spray  Cold-Eeze- up to three times per day  Cough drops, alcohol free  Flonase (by prescription only)  Guaifenesin  Mucinex  Robitussin DM (plain only, alcohol free)  Saline nasal spray/drops  Sudafed (pseudoephedrine) & Actifed * use only after [redacted] weeks gestation and if you do not have high blood pressure  Tylenol  Vicks Vaporub  Zinc lozenges  Zyrtec   Constipation:  Colace  Ducolax suppositories  Fleet enema  Glycerin suppositories  Metamucil  Milk of magnesia  Miralax  Senokot  Smooth move tea   Diarrhea:  Kaopectate  Imodium A-D   *NO pepto Bismol   Hemorrhoids:  Anusol  Anusol HC  Preparation H  Tucks   Indigestion:  Tums  Maalox  Mylanta  Zantac  Pepcid   Insomnia:  Benadryl (alcohol free) 25mg every 6 hours as needed  Tylenol PM  Unisom, no Gelcaps   Leg Cramps:  Tums  MagGel   Nausea/Vomiting:  Bonine  Dramamine  Emetrol  Ginger extract  Sea bands  Meclizine  Nausea medication to take during pregnancy:  Unisom (doxylamine succinate 25 mg tablets) Take one tablet daily at bedtime. If symptoms are not adequately controlled, the dose can be increased to a maximum recommended dose of two tablets daily (1/2 tablet in the morning, 1/2 tablet mid-afternoon and one at bedtime).  Vitamin B6 100mg tablets. Take one tablet twice a day (up to 200 mg per day).   Skin Rashes:  Aveeno products  Benadryl cream or 25mg every 6 hours as needed  Calamine Lotion  1% cortisone cream    Yeast infection:  Gyne-lotrimin 7  Monistat 7    **If taking multiple medications, please check labels to avoid duplicating the same active ingredients  **take medication as directed on the label  ** Do not exceed 4000 mg of tylenol in 24 hours  **Do not take medications that contain aspirin or ibuprofen           

## 2020-08-05 NOTE — Progress Notes (Signed)
Chart reviewed for nurse visit. Agree with plan of care.   Coren Crownover N, PA-C 08/05/2020 1:44 PM   

## 2020-08-10 ENCOUNTER — Encounter (HOSPITAL_COMMUNITY): Payer: Self-pay | Admitting: Obstetrics and Gynecology

## 2020-08-10 ENCOUNTER — Other Ambulatory Visit: Payer: Self-pay

## 2020-08-10 ENCOUNTER — Inpatient Hospital Stay (HOSPITAL_COMMUNITY)
Admission: AD | Admit: 2020-08-10 | Discharge: 2020-08-10 | Disposition: A | Discharge status: Home / Self Care | Payer: Medicaid Other | Attending: Obstetrics and Gynecology | Admitting: Obstetrics and Gynecology

## 2020-08-10 ENCOUNTER — Inpatient Hospital Stay (HOSPITAL_COMMUNITY): Payer: Medicaid Other

## 2020-08-10 DIAGNOSIS — Z888 Allergy status to other drugs, medicaments and biological substances status: Secondary | ICD-10-CM | POA: Insufficient documentation

## 2020-08-10 DIAGNOSIS — R030 Elevated blood-pressure reading, without diagnosis of hypertension: Secondary | ICD-10-CM | POA: Diagnosis not present

## 2020-08-10 DIAGNOSIS — R110 Nausea: Secondary | ICD-10-CM | POA: Diagnosis not present

## 2020-08-10 DIAGNOSIS — O26899 Other specified pregnancy related conditions, unspecified trimester: Secondary | ICD-10-CM

## 2020-08-10 DIAGNOSIS — R42 Dizziness and giddiness: Secondary | ICD-10-CM | POA: Insufficient documentation

## 2020-08-10 DIAGNOSIS — O208 Other hemorrhage in early pregnancy: Secondary | ICD-10-CM | POA: Diagnosis not present

## 2020-08-10 DIAGNOSIS — O26891 Other specified pregnancy related conditions, first trimester: Secondary | ICD-10-CM | POA: Diagnosis not present

## 2020-08-10 DIAGNOSIS — O09521 Supervision of elderly multigravida, first trimester: Secondary | ICD-10-CM | POA: Insufficient documentation

## 2020-08-10 DIAGNOSIS — R109 Unspecified abdominal pain: Secondary | ICD-10-CM | POA: Diagnosis not present

## 2020-08-10 DIAGNOSIS — Z3491 Encounter for supervision of normal pregnancy, unspecified, first trimester: Secondary | ICD-10-CM

## 2020-08-10 DIAGNOSIS — Z3A01 Less than 8 weeks gestation of pregnancy: Secondary | ICD-10-CM | POA: Diagnosis not present

## 2020-08-10 HISTORY — DX: Essential (primary) hypertension: I10

## 2020-08-10 LAB — URINALYSIS, ROUTINE W REFLEX MICROSCOPIC
Bilirubin Urine: NEGATIVE
Glucose, UA: NEGATIVE mg/dL
Hgb urine dipstick: NEGATIVE
Ketones, ur: NEGATIVE mg/dL
Leukocytes,Ua: NEGATIVE
Nitrite: NEGATIVE
Protein, ur: NEGATIVE mg/dL
Specific Gravity, Urine: 1.006 (ref 1.005–1.030)
pH: 6 (ref 5.0–8.0)

## 2020-08-10 LAB — HCG, QUANTITATIVE, PREGNANCY: hCG, Beta Chain, Quant, S: 65525 m[IU]/mL — ABNORMAL HIGH (ref <5)

## 2020-08-10 LAB — CBC
HCT: 38.1 % (ref 36.0–46.0)
Hemoglobin: 12.9 g/dL (ref 12.0–15.0)
MCH: 30.6 pg (ref 26.0–34.0)
MCHC: 33.9 g/dL (ref 30.0–36.0)
MCV: 90.5 fL (ref 80.0–100.0)
Platelets: 179 10*3/uL (ref 150–400)
RBC: 4.21 MIL/uL (ref 3.87–5.11)
RDW: 12.8 % (ref 11.5–15.5)
WBC: 8.7 10*3/uL (ref 4.0–10.5)
nRBC: 0 % (ref 0.0–0.2)

## 2020-08-10 LAB — WET PREP, GENITAL
Clue Cells Wet Prep HPF POC: NONE SEEN
Sperm: NONE SEEN
Trich, Wet Prep: NONE SEEN
Yeast Wet Prep HPF POC: NONE SEEN

## 2020-08-10 NOTE — MAU Note (Signed)
Presents with c/o not "feeling well", reports light headed and BP elevated.  Took BP @ home several times and all with similar readings of 163/139.  Pt states she monitors her BP's @ home.  Denies VB, but endorses cramping in the RLQ.

## 2020-08-10 NOTE — MAU Provider Note (Addendum)
History     CSN: [id number]  Arrival date and time: 08/10/20 1708   Event Date/Time   First Provider Initiated Contact with Patient 08/10/20 1751      Chief Complaint  Patient presents with   Light headed   BP Evaluation   HPI Patty Snyder is a 35 y.o. [id number] at 78w4dwho presents with abdominal pain and general unwellness. She states she is having lower abdominal cramping that she rates a 3/10. She denies any vaginal bleeding or discharge. She reports she is checking her blood pressure at home and reports BPs in the 160s/130s. She reports nausea and fatigue daily. She is concerned this is related to her BP.  OB History     Gravida  10   Para  5   Term  5   Preterm      AB  4   Living  5      SAB  1   IAB  3   Ectopic      Multiple  0   Live Births  5           Past Medical History:  Diagnosis Date   Asthma    last used inhaler last night   Hypertension     Past Surgical History:  Procedure Laterality Date   OVARIAN CYST SURGERY      History reviewed. No pertinent family history.  Social History   Tobacco Use   Smoking status: Never   Smokeless tobacco: Never  Vaping Use   Vaping Use: Never used  Substance Use Topics   Alcohol use: Not Currently   Drug use: Never    Allergies:  Allergies  Allergen Reactions   Iodine Nausea And Vomiting   Shellfish Allergy Nausea And Vomiting    Medications Prior to Admission  Medication Sig Dispense Refill Last Dose   albuterol (PROVENTIL) (2.5 MG/3ML) 0.083% nebulizer solution Take 3 mLs (2.5 mg dose) by nebulization every 4 (four) hours as needed for Wheezing.   08/09/2020   Prenatal Vit-Fe Fumarate-FA (PREPLUS) 27-1 MG TABS Take 1 tablet by mouth daily. 30 tablet 8 08/09/2020   acetaminophen (TYLENOL) 325 MG tablet Take 2 tablets (650 mg total) by mouth every 4 (four) hours. 30 tablet 0 Unknown   Blood Pressure Monitoring (BLOOD PRESSURE KIT) DEVI 1 Device by Does not apply route once a week.  1 each 0     Review of Systems  Constitutional:  Positive for fatigue. Negative for fever.  HENT: Negative.    Respiratory: Negative.  Negative for shortness of breath.   Cardiovascular: Negative.  Negative for chest pain.  Gastrointestinal:  Positive for abdominal pain and nausea. Negative for constipation, diarrhea and vomiting.  Genitourinary: Negative.  Negative for dysuria, vaginal bleeding and vaginal discharge.  Neurological: Negative.  Negative for dizziness and headaches.  Physical Exam   Blood pressure (!) 120/57, pulse 78, temperature 98.5 F (36.9 C), temperature source Oral, resp. rate 20, height 6' (1.829 m), weight (!) 154.7 kg, last menstrual period 06/25/2020, SpO2 100 %, unknown if currently breastfeeding.  Patient Vitals for the past 24 hrs:  BP Temp Temp src Pulse Resp SpO2 Height Weight  08/10/20 1803 (!) 120/57 -- -- 78 -- -- -- --  08/10/20 1800 -- -- -- -- -- 100 % -- --  08/10/20 1755 -- -- -- -- -- 100 % -- --  08/10/20 1748 (!) 144/111 -- -- 70 -- -- -- --  08/10/20 1725 119/62 98.5 F (36.9  C) Oral 80 20 97 % -- --  08/10/20 1719 -- -- -- -- -- -- 6' (1.829 m) (!) 154.7 kg   Physical Exam Vitals and nursing note reviewed.  Constitutional:      General: She is not in acute distress.    Appearance: She is well-developed.  HENT:     Head: Normocephalic.  Eyes:     Pupils: Pupils are equal, round, and reactive to light.  Cardiovascular:     Rate and Rhythm: Normal rate and regular rhythm.     Heart sounds: Normal heart sounds.  Pulmonary:     Effort: Pulmonary effort is normal. No respiratory distress.     Breath sounds: Normal breath sounds.  Abdominal:     General: Bowel sounds are normal. There is no distension.     Palpations: Abdomen is soft.     Tenderness: There is no abdominal tenderness.  Skin:    General: Skin is warm and dry.  Neurological:     Mental Status: She is alert and oriented to person, place, and time.  Psychiatric:         Mood and Affect: Mood normal.        Behavior: Behavior normal.        Thought Content: Thought content normal.        Judgment: Judgment normal.    MAU Course  Procedures Results for orders placed or performed during the hospital encounter of 08/10/20 (from the past 24 hour(s))  Urinalysis, Routine w reflex microscopic Urine, Clean Catch     Status: Abnormal   Collection Time: 08/10/20  5:28 PM  Result Value Ref Range   Color, Urine STRAW (A) YELLOW   APPearance CLEAR CLEAR   Specific Gravity, Urine 1.006 1.005 - 1.030   pH 6.0 5.0 - 8.0   Glucose, UA NEGATIVE NEGATIVE mg/dL   Hgb urine dipstick NEGATIVE NEGATIVE   Bilirubin Urine NEGATIVE NEGATIVE   Ketones, ur NEGATIVE NEGATIVE mg/dL   Protein, ur NEGATIVE NEGATIVE mg/dL   Nitrite NEGATIVE NEGATIVE   Leukocytes,Ua NEGATIVE NEGATIVE  Wet prep, genital     Status: Abnormal   Collection Time: 08/10/20  5:29 PM   Specimen: Vaginal  Result Value Ref Range   Yeast Wet Prep HPF POC NONE SEEN NONE SEEN   Trich, Wet Prep NONE SEEN NONE SEEN   Clue Cells Wet Prep HPF POC NONE SEEN NONE SEEN   WBC, Wet Prep HPF POC MANY (A) NONE SEEN   Sperm NONE SEEN   CBC     Status: None   Collection Time: 08/10/20  5:36 PM  Result Value Ref Range   WBC 8.7 4.0 - 10.5 K/uL   RBC 4.21 3.87 - 5.11 MIL/uL   Hemoglobin 12.9 12.0 - 15.0 g/dL   HCT 38.1 36.0 - 46.0 %   MCV 90.5 80.0 - 100.0 fL   MCH 30.6 26.0 - 34.0 pg   MCHC 33.9 30.0 - 36.0 g/dL   RDW 12.8 11.5 - 15.5 %   Platelets 179 150 - 400 K/uL   nRBC 0.0 0.0 - 0.2 %    No results found.   MDM UA, UPT CBC, HCG ABO/Rh- O Pos Wet prep and gc/chlamydia US OB Comp Less 14 weeks with Transvaginal  Patient declined nausea medication in MAU  Care turned over to L. Leftwich Baltazar Najjar CNM at [phone]   Results for orders placed or performed during the hospital encounter of 08/10/20 (from the past 24 hour(s))  Urinalysis, Routine  w reflex microscopic Urine, Clean Catch     Status: Abnormal    Collection Time: 08/10/20  5:28 PM  Result Value Ref Range   Color, Urine STRAW (A) YELLOW   APPearance CLEAR CLEAR   Specific Gravity, Urine 1.006 1.005 - 1.030   pH 6.0 5.0 - 8.0   Glucose, UA NEGATIVE NEGATIVE mg/dL   Hgb urine dipstick NEGATIVE NEGATIVE   Bilirubin Urine NEGATIVE NEGATIVE   Ketones, ur NEGATIVE NEGATIVE mg/dL   Protein, ur NEGATIVE NEGATIVE mg/dL   Nitrite NEGATIVE NEGATIVE   Leukocytes,Ua NEGATIVE NEGATIVE  Wet prep, genital     Status: Abnormal   Collection Time: 08/10/20  5:29 PM   Specimen: Vaginal  Result Value Ref Range   Yeast Wet Prep HPF POC NONE SEEN NONE SEEN   Trich, Wet Prep NONE SEEN NONE SEEN   Clue Cells Wet Prep HPF POC NONE SEEN NONE SEEN   WBC, Wet Prep HPF POC MANY (A) NONE SEEN   Sperm NONE SEEN   CBC     Status: None   Collection Time: 08/10/20  5:36 PM  Result Value Ref Range   WBC 8.7 4.0 - 10.5 K/uL   RBC 4.21 3.87 - 5.11 MIL/uL   Hemoglobin 12.9 12.0 - 15.0 g/dL   HCT 38.1 36.0 - 46.0 %   MCV 90.5 80.0 - 100.0 fL   MCH 30.6 26.0 - 34.0 pg   MCHC 33.9 30.0 - 36.0 g/dL   RDW 12.8 11.5 - 15.5 %   Platelets 179 150 - 400 K/uL   nRBC 0.0 0.0 - 0.2 %  hCG, quantitative, pregnancy     Status: Abnormal   Collection Time: 08/10/20  5:36 PM  Result Value Ref Range   hCG, Beta Chain, Quant, S 65,525 (H) <5 mIU/mL     US OB Comp Less 14 Wks  Result Date: 08/10/2020 CLINICAL DATA:  Abdominal pain EXAM: OBSTETRIC <14 WK [place] AND TRANSVAGINAL OB US TECHNIQUE: Both transabdominal and transvaginal ultrasound examinations were performed for complete evaluation of the gestation as well as the maternal uterus, adnexal regions, and pelvic cul-de-sac. Transvaginal technique was performed to assess early pregnancy. COMPARISON:  None. FINDINGS: Intrauterine gestational sac: Single Yolk sac:  Visualized. Embryo:  Visualized. Cardiac Activity: Visualized. Heart Rate: 114 bpm MSD:   mm    w     d CRL:  4.5 mm   6 w   1 d                  [place] [facility]: April 04, 2021 Subchorionic hemorrhage:  There is a small subchorionic hemorrhage. Maternal uterus/adnexae: Normal IMPRESSION: Single live IUP.  Small subchorionic hemorrhage. Electronically Signed   By: Dorise Bullion III M.D   On: 08/10/2020 18:57     Assessment and Plan   1. Normal intrauterine pregnancy on prenatal ultrasound in first trimester   2. Abdominal pain affecting pregnancy   3. [redacted] weeks gestation of pregnancy   4. Elevated blood pressure reading without diagnosis of hypertension      D/C home  BP mostly low normal, with one single borderline BP Will watch, likely CHTN but no medications started today given low normal BPs Start prenatal care as scheduled Return precautions reviewed  Fatima Blank, CNM 9:01 PM

## 2020-08-11 ENCOUNTER — Telehealth (INDEPENDENT_AMBULATORY_CARE_PROVIDER_SITE_OTHER): Payer: Medicaid Other

## 2020-08-11 DIAGNOSIS — O09529 Supervision of elderly multigravida, unspecified trimester: Secondary | ICD-10-CM

## 2020-08-11 DIAGNOSIS — Z3A00 Weeks of gestation of pregnancy not specified: Secondary | ICD-10-CM

## 2020-08-11 DIAGNOSIS — O0990 Supervision of high risk pregnancy, unspecified, unspecified trimester: Secondary | ICD-10-CM | POA: Insufficient documentation

## 2020-08-11 LAB — GC/CHLAMYDIA PROBE AMP (~~LOC~~) NOT AT ARMC
Chlamydia: NEGATIVE
Comment: NEGATIVE
Comment: NORMAL
Neisseria Gonorrhea: NEGATIVE

## 2020-08-11 MED ORDER — GOJJI WEIGHT SCALE MISC: 1.0000 | 0 refills | Status: DC | PRN

## 2020-08-11 NOTE — Patient Instructions (Signed)

## 2020-08-11 NOTE — Progress Notes (Signed)
New OB Intake  I connected with  Patty Snyder on 08/11/20 at 10:15 AM EDT by MyChart Video Visit and verified that I am speaking with the correct person using two identifiers. Nurse is located at [facility] and pt is located at home.  I discussed the limitations, risks, security and privacy concerns of performing an evaluation and management service by telephone and the availability of in person appointments. I also discussed with the patient that there may be a patient responsible charge related to this service. The patient expressed understanding and agreed to proceed.  I explained I am completing New OB Intake today. We discussed her EDD of 04/01/2021 that is based on LMP of 06/25/2020. Pt is G10/P5. I reviewed her allergies, medications, Medical/Surgical/OB history, and appropriate screenings. I informed her of [facility] services. Based on history, this is a/an  pregnancy complicated by AMA  .   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   MyChart/Babyscripts MyChart access verified. I explained pt will have some visits in office and some virtually. Babyscripts instructions given and order placed. Patient verifies receipt of registration text/e-mail. Account successfully created and app downloaded.  Blood Pressure Cuff  Pt reports that she has her bp cuff from her prior pregnancy. I advised pt to bring cuff to her NEW OB appt to make sure that it is working properly.  Explained after first prenatal appt pt will check weekly and document in Babyscripts.  Weight scale: Weight scale  ordered for patient to pick up form [facility].   Anatomy US Explained first scheduled [place] will be around 19 weeks. Anatomy US scheduled for 11/04/20 at 0845.  Labs Discussed Avelina Laine genetic screening with patient. Would like both Panorama and Horizon drawn at new OB visit. Routine prenatal labs needed.  Covid Vaccine Patient has covid vaccine.   Mother/ Baby Dyad Candidate?    If yes, offer as possibility Pt is not a candidate for Mom/Baby Dyad  Informed patient of [facility] website  and placed link in her AVS.   Social Determinants of Health Food Insecurity: Patient denies food insecurity. WIC Referral: Patient is interested in referral to [facility].  Transportation: Patient denies transportation needs. Childcare: Discussed no children allowed at ultrasound appointments. Offered childcare services; patient declines childcare services at this time.   Placed OB Box on problem list and updated  First visit review I reviewed new OB appt with pt. I explained she will have a pelvic exam, ob bloodwork with genetic screening, and PAP smear. Explained pt will be seen by Crissie Reese, DO at first visit; encounter routed to appropriate provider. Explained that patient will be seen by pregnancy navigator following visit with provider. [facility] information placed in AVS.   Ralene Bathe, RN 08/11/2020  10:17 AM

## 2020-08-25 DIAGNOSIS — Z419 Encounter for procedure for purposes other than remedying health state, unspecified: Secondary | ICD-10-CM | POA: Diagnosis not present

## 2020-09-15 ENCOUNTER — Encounter: Payer: Medicaid Other | Admitting: Nurse Practitioner

## 2020-09-16 ENCOUNTER — Ambulatory Visit (INDEPENDENT_AMBULATORY_CARE_PROVIDER_SITE_OTHER): Payer: Medicaid Other | Admitting: Family Medicine

## 2020-09-16 ENCOUNTER — Other Ambulatory Visit: Payer: Self-pay

## 2020-09-16 ENCOUNTER — Encounter: Payer: Self-pay | Admitting: Family Medicine

## 2020-09-16 ENCOUNTER — Other Ambulatory Visit (HOSPITAL_COMMUNITY)
Admission: RE | Admit: 2020-09-16 | Discharge: 2020-09-16 | Disposition: A | Discharge status: Home / Self Care | Payer: Medicaid Other | Source: Ambulatory Visit | Attending: Nurse Practitioner | Admitting: Nurse Practitioner

## 2020-09-16 VITALS — BP 127/86 | HR 83 | Wt 338.0 lb

## 2020-09-16 DIAGNOSIS — O09899 Supervision of other high risk pregnancies, unspecified trimester: Secondary | ICD-10-CM

## 2020-09-16 DIAGNOSIS — O0990 Supervision of high risk pregnancy, unspecified, unspecified trimester: Secondary | ICD-10-CM | POA: Insufficient documentation

## 2020-09-16 DIAGNOSIS — O209 Hemorrhage in early pregnancy, unspecified: Secondary | ICD-10-CM

## 2020-09-16 DIAGNOSIS — R87610 Atypical squamous cells of undetermined significance on cytologic smear of cervix (ASC-US): Secondary | ICD-10-CM

## 2020-09-16 DIAGNOSIS — Z8632 Personal history of gestational diabetes: Secondary | ICD-10-CM

## 2020-09-16 DIAGNOSIS — Z2839 Other underimmunization status: Secondary | ICD-10-CM

## 2020-09-16 DIAGNOSIS — Z8759 Personal history of other complications of pregnancy, childbirth and the puerperium: Secondary | ICD-10-CM

## 2020-09-16 DIAGNOSIS — R87810 Cervical high risk human papillomavirus (HPV) DNA test positive: Secondary | ICD-10-CM

## 2020-09-16 DIAGNOSIS — R768 Other specified abnormal immunological findings in serum: Secondary | ICD-10-CM

## 2020-09-16 LAB — OB RESULTS CONSOLE GC/CHLAMYDIA: Gonorrhea: NEGATIVE

## 2020-09-16 MED ORDER — ASPIRIN EC 81 MG PO TBEC: 81.0000 mg | DELAYED_RELEASE_TABLET | Freq: Every day | ORAL | 11 refills | Status: DC

## 2020-09-16 NOTE — Progress Notes (Signed)
Subjective:   Patty Snyder is a 35 y.o. [id number] at 24w6dby early ultrasound being seen today for her first obstetrical visit.  Her obstetrical history is significant for advanced maternal age, pregnancy induced hypertension, and hx of GDM, grand multiparity, short interval pregnancy . Pregnancy history fully reviewed.  Patient reports  a lot of stress around this pregnancy, she is unsure if she plans to continue . Pregnancy was unplanned, last delivered six months ago. Has many small children and teenagers at home and has a lot of financial difficulties. She has thought about termination but does not know how she would afford it. Also has difficulties with transportation. Has not yet had this conversation with her husband is not yet totally sure what she is going to do.   HISTORY: OB History  Gravida Para Term Preterm AB Living  _0 0 4 5  SAB IAB Ectopic Multiple Live Births  1 3 0 0 5    # Outcome Date GA Lbr Len/2nd Weight Sex Delivery Anes PTL Lv  10 Current           9 Term 03/13/20 310w3d 00:48 7 lb 10.6 oz (3.476 kg) M Vag-Spont EPI  LIV     Birth Comments: Extra digit bilateral hands     Name: Wyse,BOY Salimatou     Apgar1: 9  Apgar5: 9  8 Term 03/04/16 3968w0d lb (3.629 kg) M Vag-Spont EPI  LIV     Complications: Gestational HTN  7 SAB 2015          6 IAB 2012          5 IAB 2011          4 IAB 2010          3 Term 06/14/07 39w60w2dlb (3.629 kg) M Vag-Spont EPI  LIV  2 Term 01/27/06 37w09w0db (2.722 kg) M Vag-Spont EPI  LIV  1 Term 12/12/03 11w0d96w0d 3 oz (4.167 kg) M Vag-Spont EPI  LIV     Complications: Gestational diabetes     Last pap smear was 05/02/2019 and was abnormal - ASCUS with +HRHPV   Past Medical History:  Diagnosis Date   Asthma    last used inhaler last night   HSV (herpes simplex virus) infection    Hypertension    Past Surgical History:  Procedure Laterality Date   OVARIAN CYST SURGERY     Family History  Problem Relation Age  of Onset   Asthma Mother    Hyperlipidemia Father    Asthma Father    Social History   Tobacco Use   Smoking status: Never   Smokeless tobacco: Never  Vaping Use   Vaping Use: Never used  Substance Use Topics   Alcohol use: Not Currently   Drug use: Never   Allergies  Allergen Reactions   Iodine Nausea And Vomiting   Shellfish Allergy Nausea And Vomiting   Current Outpatient Medications on File Prior to Visit  Medication Sig Dispense Refill   acetaminophen (TYLENOL) 325 MG tablet Take 2 tablets (650 mg total) by mouth every 4 (four) hours. (Patient not taking: No sig reported) 30 tablet 0   albuterol (PROVENTIL) (2.5 MG/3ML) 0.083% nebulizer solution Take 3 mLs (2.5 mg dose) by nebulization every 4 (four) hours as needed for Wheezing. (Patient not taking: Reported on 09/16/2020)     Blood Pressure Monitoring (BLOOD PRESSURE KIT) DEVI 1 Device by  Does not apply route once a week. (Patient not taking: Reported on 09/16/2020) 1 each 0   Misc. Devices (GOJJI WEIGHT SCALE) MISC 1 Device by Does not apply route as needed. (Patient not taking: Reported on 09/16/2020) 1 each 0   Prenatal Vit-Fe Fumarate-FA (PREPLUS) 27-1 MG TABS Take 1 tablet by mouth daily. (Patient not taking: Reported on 09/16/2020) 30 tablet 8   No current facility-administered medications on file prior to visit.     Exam   There were no vitals filed for this visit.    System: General: well-developed, well-nourished female in no acute distress   Skin: normal coloration and turgor, no rashes   Neurologic: oriented, normal, negative, normal mood   Extremities: normal strength, tone, and muscle mass, ROM of all joints is normal   HEENT PERRLA, extraocular movement intact and sclera clear, anicteric   Neck supple and no masses   Respiratory:  no respiratory distress      Assessment:   Pregnancy: [id number] Patient Active Problem List   Diagnosis Date Noted   Supervision of high risk pregnancy, antepartum  08/11/2020   Indication for care in labor or delivery 03/13/2020   Bleeding in early pregnancy 08/09/2019   History of gestational diabetes 03/30/2019   History of pregnancy induced hypertension 03/30/2019   Mild intermittent asthma without complication 07/15/2016   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 Supervision of high risk pregnancy, antepartum Patient ambivalent about pregnancy at present Counseled on options regarding local providers ([facility], [facility], [facility]) as well as financial and transportation assistance ([facility]) Emphasized to patient we will support whatever choice she makes For time being she plans to get prenatal labs and discuss more with her husband  Initial labs drawn. Continue prenatal vitamins. Genetic Screening discussed, NIPS: ordered. Ultrasound discussed; fetal anatomic survey: scheduled already. Problem list reviewed and updated. The nature of [facility] with multiple MDs and other Advanced Practice Providers was explained to patient; also emphasized that residents, students are part of our team.  2. History of gestational diabetes A1c today  3. History of pregnancy induced hypertension Start prenatal ASA  4. Rubella non-immune status, antepartum Offer MMR postpartum  5. Bleeding in early pregnancy Seen in MAU, live IUP  6. HSV-2 seropositive Not addressed today  7. ASCUS pap Discussed colpo, she is OK with proceeding as soon as possible which I recommended given abnormal pap is already almost from a year and a half ago  Routine obstetric precautions reviewed. Return in about 4 weeks (around 10/14/2020) for ob visit, [facility].     

## 2020-09-17 ENCOUNTER — Encounter: Payer: Self-pay | Admitting: *Deleted

## 2020-09-17 LAB — CERVICOVAGINAL ANCILLARY ONLY
Chlamydia: NEGATIVE
Comment: NEGATIVE
Comment: NEGATIVE
Comment: NORMAL
Neisseria Gonorrhea: NEGATIVE
Trichomonas: NEGATIVE

## 2020-09-17 LAB — HEMOGLOBIN A1C
Est. average glucose Bld gHb Est-mCnc: 108 mg/dL
Hgb A1c MFr Bld: 5.4 % (ref 4.8–5.6)

## 2020-09-17 LAB — CBC/D/PLT+RPR+RH+ABO+RUBIGG...
Antibody Screen: NEGATIVE
Basophils Absolute: 0 10*3/uL (ref 0.0–0.2)
Basos: 0 %
EOS (ABSOLUTE): 0.2 10*3/uL (ref 0.0–0.4)
Eos: 3 %
HCV Ab: <0.1 s/co ratio (ref 0.0–0.9)
HIV Screen 4th Generation wRfx: NONREACTIVE
Hematocrit: 42.1 % (ref 34.0–46.6)
Hemoglobin: 13.9 g/dL (ref 11.1–15.9)
Hepatitis B Surface Ag: NEGATIVE
Immature Grans (Abs): 0 10*3/uL (ref 0.0–0.1)
Immature Granulocytes: 0 %
Lymphocytes Absolute: 2 10*3/uL (ref 0.7–3.1)
Lymphs: 27 %
MCH: 30.2 pg (ref 26.6–33.0)
MCHC: 33 g/dL (ref 31.5–35.7)
MCV: 91 fL (ref 79–97)
Monocytes Absolute: 0.4 10*3/uL (ref 0.1–0.9)
Monocytes: 6 %
Neutrophils Absolute: 4.7 10*3/uL (ref 1.4–7.0)
Neutrophils: 64 %
Platelets: 182 10*3/uL (ref 150–450)
RBC: 4.61 x10E6/uL (ref 3.77–5.28)
RDW: 13.2 % (ref 11.7–15.4)
RPR Ser Ql: NONREACTIVE
Rh Factor: POSITIVE
Rubella Antibodies, IGG: <0.9 index — ABNORMAL LOW (ref >0.99)
WBC: 7.4 10*3/uL (ref 3.4–10.8)

## 2020-09-17 LAB — HCV INTERPRETATION

## 2020-09-18 LAB — URINE CULTURE, OB REFLEX

## 2020-09-18 LAB — CULTURE, OB URINE

## 2020-09-24 ENCOUNTER — Encounter: Payer: Self-pay | Admitting: General Practice

## 2020-09-25 DIAGNOSIS — Z419 Encounter for procedure for purposes other than remedying health state, unspecified: Secondary | ICD-10-CM | POA: Diagnosis not present

## 2020-10-14 ENCOUNTER — Ambulatory Visit (INDEPENDENT_AMBULATORY_CARE_PROVIDER_SITE_OTHER): Payer: Medicaid Other

## 2020-10-14 ENCOUNTER — Other Ambulatory Visit: Payer: Self-pay

## 2020-10-14 VITALS — BP 121/60 | HR 86 | Wt 346.8 lb

## 2020-10-14 DIAGNOSIS — O0990 Supervision of high risk pregnancy, unspecified, unspecified trimester: Secondary | ICD-10-CM

## 2020-10-14 DIAGNOSIS — F32A Depression, unspecified: Secondary | ICD-10-CM

## 2020-10-14 DIAGNOSIS — Z8759 Personal history of other complications of pregnancy, childbirth and the puerperium: Secondary | ICD-10-CM

## 2020-10-14 DIAGNOSIS — O09529 Supervision of elderly multigravida, unspecified trimester: Secondary | ICD-10-CM

## 2020-10-14 DIAGNOSIS — Z2839 Other underimmunization status: Secondary | ICD-10-CM

## 2020-10-14 DIAGNOSIS — Z8632 Personal history of gestational diabetes: Secondary | ICD-10-CM

## 2020-10-14 DIAGNOSIS — O99340 Other mental disorders complicating pregnancy, unspecified trimester: Secondary | ICD-10-CM

## 2020-10-14 DIAGNOSIS — R87810 Cervical high risk human papillomavirus (HPV) DNA test positive: Secondary | ICD-10-CM

## 2020-10-14 DIAGNOSIS — R87610 Atypical squamous cells of undetermined significance on cytologic smear of cervix (ASC-US): Secondary | ICD-10-CM

## 2020-10-14 NOTE — Progress Notes (Signed)
   PRENATAL VISIT NOTE  Subjective:  Patty Snyder is a 35 y.o. [id number] at [redacted]w[redacted]d being seen today for ongoing prenatal care.  She is currently monitored for the following issues for this high-risk pregnancy and has History of gestational diabetes; History of pregnancy induced hypertension; HSV-2 seropositive; Mild intermittent asthma without complication; Rubella non-immune status, antepartum; Bleeding in early pregnancy; Indication for care in labor or delivery; Supervision of high risk pregnancy, antepartum; and ASCUS with positive high risk HPV cervical on their problem list.  Patient reports continued stress around this pregnancy. She was planning on termination, but due to financial and transportation barriers has been unable to do so. She feels stressed as she has 5 other children at home, the youngest one being only 7 months. She also reports a lot of stress because her family, including her significant other, are very happy about this pregnancy and want her to keep the baby.  Contractions: Not present. Vag. Bleeding: None.  Movement: Present. Denies leaking of fluid.   The following portions of the patient's history were reviewed and updated as appropriate: allergies, current medications, past family history, past medical history, past social history, past surgical history and problem list.   Objective:   Vitals:   10/14/20 0945  BP: 121/60  Pulse: 86  Weight: (!) 346 lb 12.8 oz (157.3 kg)    Fetal Status: Fetal Heart Rate (bpm): 152   Movement: Present     General:  Alert, oriented and cooperative. Patient is tearful  Skin: Skin is warm and dry. No rash noted.   Cardiovascular: Normal heart rate noted  Respiratory: Normal respiratory effort, no problems with respiration noted  Abdomen: Soft, gravid, appropriate for gestational age.  Pain/Pressure: Absent     Pelvic: Cervical exam deferred        Extremities: Normal range of motion.  Edema: None  Mental Status: Normal mood and  affect. Normal behavior. Normal judgment and thought content.   Assessment and Plan:  Pregnancy: [id number] at [redacted]w[redacted]d  1. Depression affecting pregnancy - Patient very tearful today, reports continued stress around this pregnancy - Denies SI/HI - Patient to meet with Roselyn Reef ASAP  - Ambulatory referral to [place]  2. Supervision of high risk pregnancy, antepartum - Re-counseled patient regarding local providers as well as provided national resources that may be able to help - Reiterated that we will support whatever choice she makes - Routine care today, FHT's present - Patient to make appointment in 4 weeks  3. Antepartum multigravida of advanced maternal age - LR NIPS  4. History of pregnancy induced hypertension - on bASA  5. History of gestational diabetes - A1C 5.4 at NOB  6. Rubella non-immune status, antepartum - Offer MMR PP3  7. ASCUS with positive high risk HPV cervical - Colpo PP   Preterm labor symptoms and general obstetric precautions including but not limited to vaginal bleeding, contractions, leaking of fluid and fetal movement were reviewed in detail with the patient. Please refer to After Visit Summary for other counseling recommendations.   Return in about 4 weeks (around 11/11/2020).  Future Appointments  Date Time Provider [place]  10/20/2020  9:45 AM [place] [facility]  11/04/2020  8:45 AM [facility] NURSE [facility]  11/04/2020  9:00 AM [facility] US1 [facility]  11/12/2020 10:15 AM Dione Plover, Annice Needy, MD [facility] [place], CNM 10/14/20 1:59 PM

## 2020-10-14 NOTE — BH Specialist Note (Signed)
  Pt did not arrive to video visit and did not answer the phone ; Unable to leave a voice message as "call cannot be completed at this time" after 3 attempts  ; left MyChart message for patient.

## 2020-10-16 DIAGNOSIS — H52223 Regular astigmatism, bilateral: Secondary | ICD-10-CM | POA: Diagnosis not present

## 2020-10-16 DIAGNOSIS — H5203 Hypermetropia, bilateral: Secondary | ICD-10-CM | POA: Diagnosis not present

## 2020-10-16 DIAGNOSIS — H5213 Myopia, bilateral: Secondary | ICD-10-CM | POA: Diagnosis not present

## 2020-10-20 ENCOUNTER — Ambulatory Visit: Payer: Medicaid Other | Admitting: Clinical

## 2020-10-20 DIAGNOSIS — Z5329 Procedure and treatment not carried out because of patient's decision for other reasons: Secondary | ICD-10-CM

## 2020-10-20 DIAGNOSIS — Z91199 Patient's noncompliance with other medical treatment and regimen due to unspecified reason: Secondary | ICD-10-CM

## 2020-10-25 DIAGNOSIS — Z419 Encounter for procedure for purposes other than remedying health state, unspecified: Secondary | ICD-10-CM | POA: Diagnosis not present

## 2020-11-04 ENCOUNTER — Other Ambulatory Visit: Payer: Self-pay | Admitting: *Deleted

## 2020-11-04 ENCOUNTER — Ambulatory Visit: Payer: Medicaid Other | Attending: Nurse Practitioner

## 2020-11-04 ENCOUNTER — Other Ambulatory Visit: Payer: Self-pay

## 2020-11-04 ENCOUNTER — Ambulatory Visit: Payer: Medicaid Other | Admitting: *Deleted

## 2020-11-04 ENCOUNTER — Encounter: Payer: Self-pay | Admitting: *Deleted

## 2020-11-04 VITALS — BP 115/63 | HR 78

## 2020-11-04 DIAGNOSIS — O0990 Supervision of high risk pregnancy, unspecified, unspecified trimester: Secondary | ICD-10-CM

## 2020-11-04 DIAGNOSIS — Z362 Encounter for other antenatal screening follow-up: Secondary | ICD-10-CM

## 2020-11-12 ENCOUNTER — Other Ambulatory Visit: Payer: Self-pay

## 2020-11-12 ENCOUNTER — Encounter: Payer: Self-pay | Admitting: Family Medicine

## 2020-11-12 ENCOUNTER — Ambulatory Visit (INDEPENDENT_AMBULATORY_CARE_PROVIDER_SITE_OTHER): Payer: Medicaid Other | Admitting: Family Medicine

## 2020-11-12 ENCOUNTER — Other Ambulatory Visit (HOSPITAL_COMMUNITY)
Admission: RE | Admit: 2020-11-12 | Discharge: 2020-11-12 | Disposition: A | Discharge status: Home / Self Care | Payer: Medicaid Other | Source: Ambulatory Visit | Attending: Family Medicine | Admitting: Family Medicine

## 2020-11-12 VITALS — BP 126/62 | HR 72 | Wt 351.6 lb

## 2020-11-12 DIAGNOSIS — Z8632 Personal history of gestational diabetes: Secondary | ICD-10-CM

## 2020-11-12 DIAGNOSIS — F419 Anxiety disorder, unspecified: Secondary | ICD-10-CM

## 2020-11-12 DIAGNOSIS — N939 Abnormal uterine and vaginal bleeding, unspecified: Secondary | ICD-10-CM

## 2020-11-12 DIAGNOSIS — Z8759 Personal history of other complications of pregnancy, childbirth and the puerperium: Secondary | ICD-10-CM

## 2020-11-12 DIAGNOSIS — F32A Depression, unspecified: Secondary | ICD-10-CM

## 2020-11-12 DIAGNOSIS — O0990 Supervision of high risk pregnancy, unspecified, unspecified trimester: Secondary | ICD-10-CM | POA: Diagnosis not present

## 2020-11-12 DIAGNOSIS — K5900 Constipation, unspecified: Secondary | ICD-10-CM

## 2020-11-12 DIAGNOSIS — R87610 Atypical squamous cells of undetermined significance on cytologic smear of cervix (ASC-US): Secondary | ICD-10-CM

## 2020-11-12 DIAGNOSIS — O09899 Supervision of other high risk pregnancies, unspecified trimester: Secondary | ICD-10-CM

## 2020-11-12 DIAGNOSIS — J4520 Mild intermittent asthma, uncomplicated: Secondary | ICD-10-CM

## 2020-11-12 DIAGNOSIS — R87810 Cervical high risk human papillomavirus (HPV) DNA test positive: Secondary | ICD-10-CM

## 2020-11-12 DIAGNOSIS — Z2839 Other underimmunization status: Secondary | ICD-10-CM

## 2020-11-12 MED ORDER — POLYETHYLENE GLYCOL 3350 17 GM/SCOOP PO POWD: 17.0000 g | Freq: Every day | ORAL | 1 refills | Status: DC | PRN

## 2020-11-12 NOTE — Progress Notes (Signed)
   Subjective:  Patty Snyder is a 35 y.o. G10P5045 at [redacted]w[redacted]d being seen today for ongoing prenatal care.  She is currently monitored for the following issues for this high-risk pregnancy and has History of gestational diabetes; History of pregnancy induced hypertension; HSV-2 seropositive; Mild intermittent asthma without complication; Rubella non-immune status, antepartum; Bleeding in early pregnancy; Indication for care in labor or delivery; Supervision of high risk pregnancy, antepartum; ASCUS with positive high risk HPV cervical; and Anxiety and depression on their problem list.  Patient reports  hemorrhoids and possibly vaginal bleeding when wiping .  Contractions: Not present. Vag. Bleeding: None.  Movement: Present. Denies leaking of fluid.   The following portions of the patient's history were reviewed and updated as appropriate: allergies, current medications, past family history, past medical history, past social history, past surgical history and problem list. Problem list updated.  Objective:   Vitals:   11/12/20 1053  BP: 126/62  Pulse: 72  Weight: (!) 351 lb 9.6 oz (159.5 kg)    Fetal Status: Fetal Heart Rate (bpm): 146   Movement: Present     General:  Alert, oriented and cooperative. Patient is in no acute distress.  Skin: Skin is warm and dry. No rash noted.   Cardiovascular: Normal heart rate noted  Respiratory: Normal respiratory effort, no problems with respiration noted  Abdomen: Soft, gravid, appropriate for gestational age. Pain/Pressure: Absent     Pelvic: Vag. Bleeding: None     Physiologic white discharge in vault, no blood, ~2 cm cervical polyp noted extruding from cervical os         Extremities: Normal range of motion.  Edema: None  Mental Status: Normal mood and affect. Normal behavior. Normal judgment and thought content.   Urinalysis:      Assessment and Plan:  Pregnancy: [id number] at [redacted]w[redacted]d  1. Supervision of high risk pregnancy, antepartum BP and  FHR normal AFP today Reports some vaginal bleeding. No blood in vaginal vault though polyp is present and may be the source of bleeding. Also endorses long hx of constipation/hemorrhoids, trial miralax. Swab also collected for vaginitis panel.   2. Anxiety and depression Reports mood significantly improved from last visit Declines further BH at this time  3. ASCUS with positive high risk HPV cervical Colpo PP  4. History of gestational diabetes Intake A1c normal at 5.4%  5. History of pregnancy induced hypertension On ASA  6. Rubella non-immune status, antepartum Offer MMR PP  Preterm labor symptoms and general obstetric precautions including but not limited to vaginal bleeding, contractions, leaking of fluid and fetal movement were reviewed in detail with the patient. Please refer to After Visit Summary for other counseling recommendations.  Return in 4 weeks (on 12/10/2020) for ob visit.   Eckstat, Matthew M, MD  

## 2020-11-12 NOTE — Patient Instructions (Signed)

## 2020-11-13 LAB — CERVICOVAGINAL ANCILLARY ONLY
Bacterial Vaginitis (gardnerella): POSITIVE — AB
Candida Glabrata: NEGATIVE
Candida Vaginitis: NEGATIVE
Chlamydia: NEGATIVE
Comment: NEGATIVE
Comment: NEGATIVE
Comment: NEGATIVE
Comment: NEGATIVE
Comment: NEGATIVE
Comment: NORMAL
Neisseria Gonorrhea: NEGATIVE
Trichomonas: NEGATIVE

## 2020-11-13 MED ORDER — METRONIDAZOLE 0.75 % VA GEL: 1.0000 | Freq: Every day | VAGINAL | 0 refills | Status: AC

## 2020-11-13 NOTE — Addendum Note (Signed)
Addended by: Merian Capron on: 11/13/2020 07:27 PM   Modules accepted: Orders

## 2020-11-18 LAB — AFP, SERUM, OPEN SPINA BIFIDA
AFP MoM: 1.04
AFP Value: 42.5 ng/mL
Gest. Age on Collection Date: 20 weeks
Maternal Age At EDD: 35.9 yr
OSBR Risk 1 IN: 10000
Test Results:: NEGATIVE
Weight: 351 [lb_av]

## 2020-11-25 DIAGNOSIS — Z419 Encounter for procedure for purposes other than remedying health state, unspecified: Secondary | ICD-10-CM | POA: Diagnosis not present

## 2020-12-02 ENCOUNTER — Ambulatory Visit: Payer: Medicaid Other

## 2020-12-11 ENCOUNTER — Encounter: Payer: Medicaid Other | Admitting: Nurse Practitioner

## 2020-12-16 ENCOUNTER — Ambulatory Visit: Payer: Medicaid Other | Attending: Obstetrics and Gynecology | Admitting: *Deleted

## 2020-12-16 ENCOUNTER — Encounter: Payer: Self-pay | Admitting: *Deleted

## 2020-12-16 ENCOUNTER — Other Ambulatory Visit: Payer: Self-pay

## 2020-12-16 ENCOUNTER — Ambulatory Visit (HOSPITAL_BASED_OUTPATIENT_CLINIC_OR_DEPARTMENT_OTHER): Payer: Medicaid Other

## 2020-12-16 ENCOUNTER — Other Ambulatory Visit: Payer: Self-pay | Admitting: *Deleted

## 2020-12-16 VITALS — BP 125/56 | HR 81

## 2020-12-16 DIAGNOSIS — E669 Obesity, unspecified: Secondary | ICD-10-CM | POA: Diagnosis not present

## 2020-12-16 DIAGNOSIS — O09522 Supervision of elderly multigravida, second trimester: Secondary | ICD-10-CM

## 2020-12-16 DIAGNOSIS — Z362 Encounter for other antenatal screening follow-up: Secondary | ICD-10-CM

## 2020-12-16 DIAGNOSIS — Z3A24 24 weeks gestation of pregnancy: Secondary | ICD-10-CM | POA: Diagnosis not present

## 2020-12-16 DIAGNOSIS — O09292 Supervision of pregnancy with other poor reproductive or obstetric history, second trimester: Secondary | ICD-10-CM

## 2020-12-16 DIAGNOSIS — O99212 Obesity complicating pregnancy, second trimester: Secondary | ICD-10-CM | POA: Diagnosis not present

## 2020-12-16 DIAGNOSIS — Z6841 Body Mass Index (BMI) 40.0 and over, adult: Secondary | ICD-10-CM

## 2020-12-16 DIAGNOSIS — O0990 Supervision of high risk pregnancy, unspecified, unspecified trimester: Secondary | ICD-10-CM

## 2020-12-16 NOTE — Progress Notes (Unsigned)
Us/

## 2020-12-23 ENCOUNTER — Ambulatory Visit (INDEPENDENT_AMBULATORY_CARE_PROVIDER_SITE_OTHER): Payer: Medicaid Other | Admitting: Obstetrics and Gynecology

## 2020-12-23 ENCOUNTER — Other Ambulatory Visit: Payer: Self-pay

## 2020-12-23 ENCOUNTER — Encounter: Payer: Medicaid Other | Admitting: Obstetrics and Gynecology

## 2020-12-23 VITALS — BP 127/80 | HR 81 | Wt 359.0 lb

## 2020-12-23 DIAGNOSIS — R87610 Atypical squamous cells of undetermined significance on cytologic smear of cervix (ASC-US): Secondary | ICD-10-CM

## 2020-12-23 DIAGNOSIS — O0990 Supervision of high risk pregnancy, unspecified, unspecified trimester: Secondary | ICD-10-CM

## 2020-12-23 DIAGNOSIS — Z8759 Personal history of other complications of pregnancy, childbirth and the puerperium: Secondary | ICD-10-CM

## 2020-12-23 DIAGNOSIS — Z8632 Personal history of gestational diabetes: Secondary | ICD-10-CM

## 2020-12-23 DIAGNOSIS — R87810 Cervical high risk human papillomavirus (HPV) DNA test positive: Secondary | ICD-10-CM

## 2020-12-23 NOTE — Progress Notes (Signed)
   PRENATAL VISIT NOTE  Subjective:  Patty Snyder is a 35 y.o. [id number] at [redacted]w[redacted]d being seen today for ongoing prenatal care.  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  Patient reports no complaints.  Contractions: Not present.  .  Movement: Present. Denies leaking of fluid.   The following portions of the patient's history were reviewed and updated as appropriate: allergies, current medications, past family history, past medical history, past social history, past surgical history and problem list.   Objective:   Vitals:   12/23/20 1331  BP: 127/80  Pulse: 81  Weight: (!) 359 lb (162.8 kg)    Fetal Status: Fetal Heart Rate (bpm): 156   Movement: Present     General:  Alert, oriented and cooperative. Patient is in no acute distress.  Skin: Skin is warm and dry. No rash noted.   Cardiovascular: Normal heart rate noted  Respiratory: Normal respiratory effort, no problems with respiration noted  Abdomen: Soft, gravid, appropriate for gestational age.  Pain/Pressure: Absent     Pelvic: Cervical exam deferred        Extremities: Normal range of motion.  Edema: None  Mental Status: Normal mood and affect. Normal behavior. Normal judgment and thought content.   Assessment and Plan:  Pregnancy: [id number] at [redacted]w[redacted]d 1. Supervision of high risk pregnancy, antepartum  1. Supervision of high risk pregnancy, antepartum  Plan IUD PP Doing well.  Rubella non-immune   2. ASCUS with positive high risk HPV cervical  Planning colpo PP  3. History of gestational diabetes  GTT next visit. A1c 5.4  4. History of pregnancy  induced hypertension  She is not taking BASA, we talked about the importance of taking daily.   Preterm labor symptoms and general obstetric precautions including but not limited to vaginal bleeding, contractions, leaking of fluid and fetal movement were reviewed in detail with the patient. Please refer to After Visit Summary for other counseling recommendations.   Return in about 4 weeks (around 01/20/2021), or 2 hour GTT with Any provider..  Future Appointments  Date Time Provider Department Center  01/13/2021  1:30 PM [facility] NURSE [facility]  01/13/2021  1:45 PM [facility] US5 [facility]  01/21/2021  8:15 AM Bernerd Limbo, CNM [facility]  01/21/2021  9:30 AM [facility] LAB [facility]    Venia Carbon, NP

## 2020-12-25 DIAGNOSIS — Z419 Encounter for procedure for purposes other than remedying health state, unspecified: Secondary | ICD-10-CM | POA: Diagnosis not present

## 2021-01-13 ENCOUNTER — Other Ambulatory Visit: Payer: Self-pay

## 2021-01-13 ENCOUNTER — Ambulatory Visit: Payer: Medicaid Other | Attending: Obstetrics and Gynecology

## 2021-01-13 ENCOUNTER — Other Ambulatory Visit: Payer: Self-pay | Admitting: *Deleted

## 2021-01-13 ENCOUNTER — Ambulatory Visit: Payer: Medicaid Other | Admitting: *Deleted

## 2021-01-13 VITALS — BP 120/75 | HR 84

## 2021-01-13 DIAGNOSIS — O0990 Supervision of high risk pregnancy, unspecified, unspecified trimester: Secondary | ICD-10-CM | POA: Insufficient documentation

## 2021-01-13 DIAGNOSIS — Z362 Encounter for other antenatal screening follow-up: Secondary | ICD-10-CM | POA: Diagnosis not present

## 2021-01-13 DIAGNOSIS — Z8632 Personal history of gestational diabetes: Secondary | ICD-10-CM | POA: Diagnosis not present

## 2021-01-13 DIAGNOSIS — Z3A28 28 weeks gestation of pregnancy: Secondary | ICD-10-CM | POA: Diagnosis not present

## 2021-01-13 DIAGNOSIS — O09293 Supervision of pregnancy with other poor reproductive or obstetric history, third trimester: Secondary | ICD-10-CM

## 2021-01-13 DIAGNOSIS — O09522 Supervision of elderly multigravida, second trimester: Secondary | ICD-10-CM | POA: Diagnosis not present

## 2021-01-13 DIAGNOSIS — O09292 Supervision of pregnancy with other poor reproductive or obstetric history, second trimester: Secondary | ICD-10-CM | POA: Diagnosis not present

## 2021-01-13 DIAGNOSIS — Z6841 Body Mass Index (BMI) 40.0 and over, adult: Secondary | ICD-10-CM | POA: Insufficient documentation

## 2021-01-13 DIAGNOSIS — O09523 Supervision of elderly multigravida, third trimester: Secondary | ICD-10-CM | POA: Diagnosis not present

## 2021-01-13 DIAGNOSIS — O99213 Obesity complicating pregnancy, third trimester: Secondary | ICD-10-CM

## 2021-01-21 ENCOUNTER — Other Ambulatory Visit: Payer: Medicaid Other

## 2021-01-21 ENCOUNTER — Other Ambulatory Visit: Payer: Self-pay | Admitting: General Practice

## 2021-01-21 ENCOUNTER — Ambulatory Visit (INDEPENDENT_AMBULATORY_CARE_PROVIDER_SITE_OTHER): Payer: Medicaid Other | Admitting: Certified Nurse Midwife

## 2021-01-21 ENCOUNTER — Other Ambulatory Visit: Payer: Self-pay

## 2021-01-21 VITALS — BP 115/53 | HR 87 | Wt 363.4 lb

## 2021-01-21 DIAGNOSIS — O0990 Supervision of high risk pregnancy, unspecified, unspecified trimester: Secondary | ICD-10-CM

## 2021-01-21 DIAGNOSIS — O0993 Supervision of high risk pregnancy, unspecified, third trimester: Secondary | ICD-10-CM

## 2021-01-21 DIAGNOSIS — Z23 Encounter for immunization: Secondary | ICD-10-CM | POA: Diagnosis not present

## 2021-01-21 DIAGNOSIS — Z8632 Personal history of gestational diabetes: Secondary | ICD-10-CM

## 2021-01-21 DIAGNOSIS — Z3A30 30 weeks gestation of pregnancy: Secondary | ICD-10-CM

## 2021-01-21 DIAGNOSIS — Z8759 Personal history of other complications of pregnancy, childbirth and the puerperium: Secondary | ICD-10-CM

## 2021-01-21 NOTE — Progress Notes (Signed)
° °  PRENATAL VISIT NOTE  Subjective:  Patty Snyder is a 35 y.o. [id number] at [redacted]w[redacted]d being seen today for ongoing prenatal care.  She is currently monitored for the following issues for this high-risk pregnancy and has History of gestational diabetes; History of pregnancy induced hypertension; HSV-2 seropositive; Mild intermittent asthma without complication; Rubella non-immune status, antepartum; Supervision of high risk pregnancy, antepartum; ASCUS with positive high risk HPV cervical; and Anxiety and depression on their problem list.  Patient reports no complaints.  Contractions: Irritability. Vag. Bleeding: None.  Movement: Present. Denies leaking of fluid.   The following portions of the patient's history were reviewed and updated as appropriate: allergies, current medications, past family history, past medical history, past social history, past surgical history and problem list.   Objective:   Vitals:   01/21/21 0832  BP: (!) 115/53  Pulse: 87  Weight: (!) 363 lb 6.4 oz (164.8 kg)   Fetal Status: Fetal Heart Rate (bpm): 143   Movement: Present     General:  Alert, oriented and cooperative. Patient is in no acute distress.  Skin: Skin is warm and dry. No rash noted.   Cardiovascular: Normal heart rate noted  Respiratory: Normal respiratory effort, no problems with respiration noted  Abdomen: Soft, gravid, appropriate for gestational age.  Pain/Pressure: Present     Pelvic: Cervical exam deferred        Extremities: Normal range of motion.  Edema: Trace  Mental Status: Normal mood and affect. Normal behavior. Normal judgment and thought content.   Assessment and Plan:  Pregnancy: [id number] at [redacted]w[redacted]d 1. Supervision of low-risk pregnancy, third trimester - Doing well, feeling regular and vigorous fetal movement   2. [redacted] weeks gestation of pregnancy - Routine OB care  - Tdap vaccine greater than or equal to 7yo IM - Flu Vaccine QUAD 36+ mos IM (Fluarix, Quad PF)  3. History of  gestational diabetes - GTT screening today, will follow closely and manage accordingly  4. History of gestational hypertension - normotensive, no s/sx of PEC  Preterm labor symptoms and general obstetric precautions including but not limited to vaginal bleeding, contractions, leaking of fluid and fetal movement were reviewed in detail with the patient. Please refer to After Visit Summary for other counseling recommendations.   Return in about 2 weeks (around 02/04/2021) for IN-PERSON, LOB.  Future Appointments  Date Time Provider Department Center  02/09/2021  9:45 AM [facility] NURSE [facility]  02/09/2021 10:00 AM [facility] US1 [facility]  02/09/2021 11:15 AM  Bing, MD [facility]  02/24/2021  9:45 AM [facility] NURSE [facility]  02/24/2021 10:00 AM [facility] US1 [facility]   Bernerd Limbo, CNM

## 2021-01-22 LAB — GLUCOSE TOLERANCE, 2 HOURS W/ 1HR
Glucose, 1 hour: 110 mg/dL (ref 70–179)
Glucose, 2 hour: 109 mg/dL (ref 70–152)
Glucose, Fasting: 85 mg/dL (ref 70–91)

## 2021-01-22 LAB — CBC
Hematocrit: 37.1 % (ref 34.0–46.6)
Hemoglobin: 12.2 g/dL (ref 11.1–15.9)
MCH: 29.3 pg (ref 26.6–33.0)
MCHC: 32.9 g/dL (ref 31.5–35.7)
MCV: 89 fL (ref 79–97)
Platelets: 158 10*3/uL (ref 150–450)
RBC: 4.16 x10E6/uL (ref 3.77–5.28)
RDW: 12.4 % (ref 11.7–15.4)
WBC: 8.6 10*3/uL (ref 3.4–10.8)

## 2021-01-22 LAB — HIV ANTIBODY (ROUTINE TESTING W REFLEX): HIV Screen 4th Generation wRfx: NONREACTIVE

## 2021-01-22 LAB — RPR: RPR Ser Ql: NONREACTIVE

## 2021-01-25 DIAGNOSIS — Z419 Encounter for procedure for purposes other than remedying health state, unspecified: Secondary | ICD-10-CM | POA: Diagnosis not present

## 2021-01-25 NOTE — L&D Delivery Note (Addendum)
OB/GYN Faculty Practice Delivery Note  Patty Snyder is a 36 y.o. [id number] s/p SVD at [redacted]w[redacted]d. She was admitted for IOL due to elevated blood pressures.   ROM: 0h 35m with clear fluid GBS Status: Positive, PCN given   Delivery Date/Time: 8938 Delivery: Called to room and patient was complete and pushing. Head delivered LOA. No nuchal cord present. Shoulder and body delivered in usual fashion. Infant with spontaneous cry, placed on mother's abdomen, dried and stimulated. Cord clamped x 2 after 1-minute delay, and cut by family member under my direct supervision. Cord blood drawn. Placenta delivered spontaneously right after  Fundus firm with massage and Pitocin. Labia, perineum, vagina, and cervix were inspected, no lacerations sustained. Post-placental Liletta placed without complication.   Placenta: Intact, 3VC - sent to L&D Complications: None Lacerations: None EBL: 250 mL Analgesia: Epidural  Infant: Viable female   APGARs 9 and 9   weight pending  Anselm Pancoast, DO PGY-1 03/20/2021, 2:15 AM  The above was performed under my direct supervision and guidance.

## 2021-02-09 ENCOUNTER — Ambulatory Visit: Payer: Medicaid Other | Admitting: *Deleted

## 2021-02-09 ENCOUNTER — Other Ambulatory Visit: Payer: Self-pay

## 2021-02-09 ENCOUNTER — Ambulatory Visit (INDEPENDENT_AMBULATORY_CARE_PROVIDER_SITE_OTHER): Payer: Medicaid Other | Admitting: Obstetrics and Gynecology

## 2021-02-09 ENCOUNTER — Other Ambulatory Visit: Payer: Self-pay | Admitting: *Deleted

## 2021-02-09 ENCOUNTER — Encounter: Payer: Self-pay | Admitting: Obstetrics and Gynecology

## 2021-02-09 ENCOUNTER — Ambulatory Visit: Payer: Medicaid Other | Attending: Obstetrics and Gynecology

## 2021-02-09 ENCOUNTER — Encounter: Payer: Self-pay | Admitting: *Deleted

## 2021-02-09 VITALS — BP 132/59 | HR 86

## 2021-02-09 VITALS — BP 127/74 | HR 87 | Wt 371.7 lb

## 2021-02-09 DIAGNOSIS — Z8632 Personal history of gestational diabetes: Secondary | ICD-10-CM

## 2021-02-09 DIAGNOSIS — O99210 Obesity complicating pregnancy, unspecified trimester: Secondary | ICD-10-CM

## 2021-02-09 DIAGNOSIS — O09293 Supervision of pregnancy with other poor reproductive or obstetric history, third trimester: Secondary | ICD-10-CM

## 2021-02-09 DIAGNOSIS — O0990 Supervision of high risk pregnancy, unspecified, unspecified trimester: Secondary | ICD-10-CM

## 2021-02-09 DIAGNOSIS — O09523 Supervision of elderly multigravida, third trimester: Secondary | ICD-10-CM | POA: Insufficient documentation

## 2021-02-09 DIAGNOSIS — O99213 Obesity complicating pregnancy, third trimester: Secondary | ICD-10-CM

## 2021-02-09 DIAGNOSIS — Z8759 Personal history of other complications of pregnancy, childbirth and the puerperium: Secondary | ICD-10-CM

## 2021-02-09 DIAGNOSIS — E669 Obesity, unspecified: Secondary | ICD-10-CM | POA: Diagnosis not present

## 2021-02-09 DIAGNOSIS — J4520 Mild intermittent asthma, uncomplicated: Secondary | ICD-10-CM

## 2021-02-09 DIAGNOSIS — R87810 Cervical high risk human papillomavirus (HPV) DNA test positive: Secondary | ICD-10-CM

## 2021-02-09 DIAGNOSIS — Z3A32 32 weeks gestation of pregnancy: Secondary | ICD-10-CM

## 2021-02-09 DIAGNOSIS — F419 Anxiety disorder, unspecified: Secondary | ICD-10-CM

## 2021-02-09 DIAGNOSIS — J45909 Unspecified asthma, uncomplicated: Secondary | ICD-10-CM

## 2021-02-09 DIAGNOSIS — Z6841 Body Mass Index (BMI) 40.0 and over, adult: Secondary | ICD-10-CM

## 2021-02-09 DIAGNOSIS — R768 Other specified abnormal immunological findings in serum: Secondary | ICD-10-CM

## 2021-02-09 DIAGNOSIS — R87610 Atypical squamous cells of undetermined significance on cytologic smear of cervix (ASC-US): Secondary | ICD-10-CM

## 2021-02-09 DIAGNOSIS — F32A Depression, unspecified: Secondary | ICD-10-CM

## 2021-02-09 DIAGNOSIS — O99511 Diseases of the respiratory system complicating pregnancy, first trimester: Secondary | ICD-10-CM

## 2021-02-09 MED ORDER — FAMOTIDINE 20 MG PO TABS: 20.0000 mg | ORAL_TABLET | Freq: Two times a day (BID) | ORAL | 3 refills | Status: DC

## 2021-02-09 MED ORDER — ADVAIR DISKUS 250-50 MCG/ACT IN AEPB: 1.0000 | INHALATION_SPRAY | Freq: Every day | RESPIRATORY_TRACT | 3 refills | Status: DC

## 2021-02-09 MED ORDER — MONTELUKAST SODIUM 10 MG PO TABS: 10.0000 mg | ORAL_TABLET | Freq: Every day | ORAL | 1 refills | Status: DC

## 2021-02-09 NOTE — Progress Notes (Signed)
° ° °  PRENATAL VISIT NOTE  Subjective:  Patty Snyder is a 36 y.o. [id number] at [redacted]w[redacted]d being seen today for ongoing prenatal care.  She is currently monitored for the following issues for this high-risk pregnancy and has History of pregnancy induced hypertension; HSV-2 seropositive; Mild intermittent asthma without complication; Rubella non-immune status, antepartum; Supervision of high risk pregnancy, antepartum; ASCUS with positive high risk HPV cervical; Anxiety and depression; and Multigravida of advanced maternal age in third trimester on their problem list.  Patient reports no complaints.  Contractions: Irritability. Vag. Bleeding: None.  Movement: Present. Denies leaking of fluid.   The following portions of the patient's history were reviewed and updated as appropriate: allergies, current medications, past family history, past medical history, past social history, past surgical history and problem list.   Objective:   Vitals:   02/09/21 1109  BP: 127/74  Pulse: 87  Weight: (!) 371 lb 11.2 oz (168.6 kg)    Fetal Status: Fetal Heart Rate (bpm): 155   Movement: Present     General:  Alert, oriented and cooperative. Patient is in no acute distress.  Skin: Skin is warm and dry. No rash noted.   Cardiovascular: Normal heart rate noted  Respiratory: Normal respiratory effort, no problems with respiration noted  Abdomen: Soft, gravid, appropriate for gestational age.  Pain/Pressure: Present     Pelvic: Cervical exam deferred        Extremities: Normal range of motion.  Edema: Trace  Mental Status: Normal mood and affect. Normal behavior. Normal judgment and thought content.   Assessment and Plan:  Pregnancy: [id number] at [redacted]w[redacted]d 1. Asthma affecting pregnancy in first trimester Need advair refill. Pt needing rescue inh 2-4x a week. She does have gerd s/s. Advair refill sent in and sent in pepcid and singulair too  2. BMI 50.0-59.9, adult [facility]) Recommend she try and stay in the 370  rang  3. Obesity in pregnancy  4. HSV-2 seropositive Start ppx 34-36wks  5. Supervision of high risk pregnancy, antepartum F/u final results from growth u/s today. Pt to start qwk testing on 1/31  6. History of pregnancy induced hypertension Normal today  7. History of gestational diabetes S/p normal 2h GTT last visit  8. ASCUS with positive high risk HPV cervical Colpo or pap smear PP  9. Anxiety and depression No issues today  10. Mild intermittent asthma without complication - ADVAIR DISKUS 250-50 MCG/ACT AEPB; Inhale 1 puff into the lungs daily.  Dispense: 1 each; Refill: 3  11. Multigravida of advanced maternal age in third trimester No issues  Preterm labor symptoms and general obstetric precautions including but not limited to vaginal bleeding, contractions, leaking of fluid and fetal movement were reviewed in detail with the patient. Please refer to After Visit Summary for other counseling recommendations.   Return in 15 days (on 02/24/2021) for in person, md or app, high risk ob.  Future Appointments  Date Time Provider [place]  02/24/2021  9:45 AM [facility] NURSE [facility]  02/24/2021 10:00 AM [facility] US1 [facility]  02/24/2021  2:55 PM Clarnce Flock, MD [facility]  03/02/2021  9:15 AM [facility] NST [facility]  03/02/2021 10:35 AM Woodroe Mode, MD [facility]  03/11/2021  9:15 AM [facility] NURSE [facility]  03/11/2021  9:30 AM [facility] US3 [facility]  03/16/2021  8:15 AM [facility] NST [facility]  03/16/2021  9:15 AM Burleson, Rona Ravens, NP [facility]    Aletha Halim, MD

## 2021-02-24 ENCOUNTER — Other Ambulatory Visit: Payer: Self-pay

## 2021-02-24 ENCOUNTER — Ambulatory Visit (INDEPENDENT_AMBULATORY_CARE_PROVIDER_SITE_OTHER): Payer: Medicaid Other | Admitting: Family Medicine

## 2021-02-24 ENCOUNTER — Ambulatory Visit: Payer: Medicaid Other | Attending: Obstetrics and Gynecology

## 2021-02-24 ENCOUNTER — Ambulatory Visit: Payer: Medicaid Other | Admitting: *Deleted

## 2021-02-24 VITALS — BP 89/64 | HR 90 | Wt 374.9 lb

## 2021-02-24 VITALS — BP 137/58 | HR 91

## 2021-02-24 DIAGNOSIS — O0990 Supervision of high risk pregnancy, unspecified, unspecified trimester: Secondary | ICD-10-CM

## 2021-02-24 DIAGNOSIS — O09523 Supervision of elderly multigravida, third trimester: Secondary | ICD-10-CM | POA: Diagnosis not present

## 2021-02-24 DIAGNOSIS — O99213 Obesity complicating pregnancy, third trimester: Secondary | ICD-10-CM | POA: Diagnosis not present

## 2021-02-24 DIAGNOSIS — R87610 Atypical squamous cells of undetermined significance on cytologic smear of cervix (ASC-US): Secondary | ICD-10-CM

## 2021-02-24 DIAGNOSIS — Z2839 Other underimmunization status: Secondary | ICD-10-CM

## 2021-02-24 DIAGNOSIS — Z3A34 34 weeks gestation of pregnancy: Secondary | ICD-10-CM | POA: Diagnosis not present

## 2021-02-24 DIAGNOSIS — J4520 Mild intermittent asthma, uncomplicated: Secondary | ICD-10-CM

## 2021-02-24 DIAGNOSIS — R768 Other specified abnormal immunological findings in serum: Secondary | ICD-10-CM

## 2021-02-24 DIAGNOSIS — R87810 Cervical high risk human papillomavirus (HPV) DNA test positive: Secondary | ICD-10-CM

## 2021-02-24 DIAGNOSIS — O09293 Supervision of pregnancy with other poor reproductive or obstetric history, third trimester: Secondary | ICD-10-CM | POA: Insufficient documentation

## 2021-02-24 DIAGNOSIS — O09899 Supervision of other high risk pregnancies, unspecified trimester: Secondary | ICD-10-CM

## 2021-02-24 DIAGNOSIS — F32A Depression, unspecified: Secondary | ICD-10-CM

## 2021-02-24 DIAGNOSIS — Z8759 Personal history of other complications of pregnancy, childbirth and the puerperium: Secondary | ICD-10-CM

## 2021-02-24 DIAGNOSIS — Z8632 Personal history of gestational diabetes: Secondary | ICD-10-CM | POA: Insufficient documentation

## 2021-02-24 DIAGNOSIS — F419 Anxiety disorder, unspecified: Secondary | ICD-10-CM

## 2021-02-24 NOTE — Progress Notes (Signed)
° °  Subjective:  Patty Snyder is a 36 y.o. [id number] at [redacted]w[redacted]d being seen today for ongoing prenatal care.  She is currently monitored for the following issues for this high-risk pregnancy and has History of pregnancy induced hypertension; HSV-2 seropositive; Mild intermittent asthma without complication; Rubella non-immune status, antepartum; Supervision of high risk pregnancy, antepartum; ASCUS with positive high risk HPV cervical; Anxiety and depression; and Multigravida of advanced maternal age in third trimester on their problem list.  Patient reports no complaints.  Contractions: Irregular. Vag. Bleeding: None.  Movement: Present. Denies leaking of fluid.   The following portions of the patient's history were reviewed and updated as appropriate: allergies, current medications, past family history, past medical history, past social history, past surgical history and problem list. Problem list updated.  Objective:   Vitals:   02/24/21 1502  BP: (!) 89/64  Pulse: 90  Weight: (!) 374 lb 14.4 oz (170.1 kg)    Fetal Status: Fetal Heart Rate (bpm): 146   Movement: Present     General:  Alert, oriented and cooperative. Patient is in no acute distress.  Skin: Skin is warm and dry. No rash noted.   Cardiovascular: Normal heart rate noted  Respiratory: Normal respiratory effort, no problems with respiration noted  Abdomen: Soft, gravid, appropriate for gestational age. Pain/Pressure: Present     Pelvic: Vag. Bleeding: None     Cervical exam deferred        Extremities: Normal range of motion.  Edema: Trace  Mental Status: Normal mood and affect. Normal behavior. Normal judgment and thought content.   Urinalysis:      Assessment and Plan:  Pregnancy: [id number] at [redacted]w[redacted]d  1. Supervision of high risk pregnancy, antepartum BP and FHR normal Having intense pubic pain. Discussed concern for pubic separation, advised maternity belt for now and XR postpartum Breech on most recent US, discussed  that she is an excellent candidate for vaginal breech delivery but that baby will likely turn or we can perform ECV prior to that scenario Will start discussion with partners to make sure plan is in place Would also like 39wk IOL as she is so uncomfortable, schedule at next visit  2. HSV-2 seropositive Reports no outbreaks ever  3. Mild intermittent asthma without complication stable  4. History of pregnancy induced hypertension   5. Rubella non-immune status, antepartum Offer MMR pp  6. ASCUS with positive high risk HPV cervical Pp colpo  7. Anxiety and depression stable  8. Multigravida of advanced maternal age in third trimester   Preterm labor symptoms and general obstetric precautions including but not limited to vaginal bleeding, contractions, leaking of fluid and fetal movement were reviewed in detail with the patient. Please refer to After Visit Summary for other counseling recommendations.  Return in 2 weeks (on 03/10/2021).   Clarnce Flock, MD

## 2021-02-24 NOTE — Patient Instructions (Signed)

## 2021-02-25 DIAGNOSIS — Z419 Encounter for procedure for purposes other than remedying health state, unspecified: Secondary | ICD-10-CM | POA: Diagnosis not present

## 2021-03-02 ENCOUNTER — Encounter: Payer: Medicaid Other | Admitting: Obstetrics & Gynecology

## 2021-03-02 ENCOUNTER — Ambulatory Visit (INDEPENDENT_AMBULATORY_CARE_PROVIDER_SITE_OTHER): Payer: Medicaid Other | Admitting: General Practice

## 2021-03-02 ENCOUNTER — Ambulatory Visit (INDEPENDENT_AMBULATORY_CARE_PROVIDER_SITE_OTHER): Payer: Medicaid Other

## 2021-03-02 ENCOUNTER — Other Ambulatory Visit: Payer: Self-pay

## 2021-03-02 VITALS — BP 110/64 | HR 87

## 2021-03-02 DIAGNOSIS — Z3A35 35 weeks gestation of pregnancy: Secondary | ICD-10-CM | POA: Diagnosis not present

## 2021-03-02 DIAGNOSIS — O99210 Obesity complicating pregnancy, unspecified trimester: Secondary | ICD-10-CM | POA: Diagnosis not present

## 2021-03-02 NOTE — Progress Notes (Signed)
Pt informed that the ultrasound is considered a limited OB ultrasound and is not intended to be a complete ultrasound exam.  Patient also informed that the ultrasound is not being completed with the intent of assessing for fetal or placental anomalies or any pelvic abnormalities.  Explained that the purpose of today’s ultrasound is to assess for  BPP, presentation, and AFI.  Patient acknowledges the purpose of the exam and the limitations of the study.    ° °Korry Dalgleish H RN BSN °03/02/21 ° °

## 2021-03-11 ENCOUNTER — Ambulatory Visit: Payer: Medicaid Other | Attending: Obstetrics

## 2021-03-11 ENCOUNTER — Ambulatory Visit: Payer: Medicaid Other | Admitting: *Deleted

## 2021-03-11 ENCOUNTER — Other Ambulatory Visit: Payer: Self-pay

## 2021-03-11 ENCOUNTER — Encounter: Payer: Self-pay | Admitting: *Deleted

## 2021-03-11 ENCOUNTER — Other Ambulatory Visit: Payer: Self-pay | Admitting: *Deleted

## 2021-03-11 VITALS — BP 129/74 | HR 86

## 2021-03-11 DIAGNOSIS — O0990 Supervision of high risk pregnancy, unspecified, unspecified trimester: Secondary | ICD-10-CM | POA: Insufficient documentation

## 2021-03-11 DIAGNOSIS — Z3A37 37 weeks gestation of pregnancy: Secondary | ICD-10-CM

## 2021-03-11 DIAGNOSIS — O99213 Obesity complicating pregnancy, third trimester: Secondary | ICD-10-CM | POA: Diagnosis not present

## 2021-03-11 DIAGNOSIS — O09523 Supervision of elderly multigravida, third trimester: Secondary | ICD-10-CM

## 2021-03-11 DIAGNOSIS — Z8632 Personal history of gestational diabetes: Secondary | ICD-10-CM | POA: Diagnosis not present

## 2021-03-11 DIAGNOSIS — O09299 Supervision of pregnancy with other poor reproductive or obstetric history, unspecified trimester: Secondary | ICD-10-CM

## 2021-03-11 DIAGNOSIS — O09293 Supervision of pregnancy with other poor reproductive or obstetric history, third trimester: Secondary | ICD-10-CM | POA: Diagnosis not present

## 2021-03-11 DIAGNOSIS — Z6841 Body Mass Index (BMI) 40.0 and over, adult: Secondary | ICD-10-CM

## 2021-03-16 ENCOUNTER — Ambulatory Visit (INDEPENDENT_AMBULATORY_CARE_PROVIDER_SITE_OTHER): Payer: Medicaid Other | Admitting: Nurse Practitioner

## 2021-03-16 ENCOUNTER — Other Ambulatory Visit: Payer: Self-pay

## 2021-03-16 ENCOUNTER — Other Ambulatory Visit: Payer: Medicaid Other

## 2021-03-16 ENCOUNTER — Other Ambulatory Visit (HOSPITAL_COMMUNITY)
Admission: RE | Admit: 2021-03-16 | Discharge: 2021-03-16 | Disposition: A | Discharge status: Home / Self Care | Payer: Medicaid Other | Source: Ambulatory Visit | Attending: Nurse Practitioner | Admitting: Nurse Practitioner

## 2021-03-16 VITALS — BP 128/89 | HR 106 | Wt 383.2 lb

## 2021-03-16 DIAGNOSIS — O321XX0 Maternal care for breech presentation, not applicable or unspecified: Secondary | ICD-10-CM

## 2021-03-16 DIAGNOSIS — Z79899 Other long term (current) drug therapy: Secondary | ICD-10-CM | POA: Insufficient documentation

## 2021-03-16 DIAGNOSIS — R768 Other specified abnormal immunological findings in serum: Secondary | ICD-10-CM | POA: Diagnosis not present

## 2021-03-16 DIAGNOSIS — Z3A36 36 weeks gestation of pregnancy: Secondary | ICD-10-CM | POA: Insufficient documentation

## 2021-03-16 DIAGNOSIS — Z6841 Body Mass Index (BMI) 40.0 and over, adult: Secondary | ICD-10-CM

## 2021-03-16 DIAGNOSIS — Z8759 Personal history of other complications of pregnancy, childbirth and the puerperium: Secondary | ICD-10-CM

## 2021-03-16 DIAGNOSIS — O0990 Supervision of high risk pregnancy, unspecified, unspecified trimester: Secondary | ICD-10-CM

## 2021-03-16 DIAGNOSIS — O26893 Other specified pregnancy related conditions, third trimester: Secondary | ICD-10-CM | POA: Insufficient documentation

## 2021-03-16 DIAGNOSIS — O0993 Supervision of high risk pregnancy, unspecified, third trimester: Secondary | ICD-10-CM | POA: Insufficient documentation

## 2021-03-16 MED ORDER — VALACYCLOVIR HCL 500 MG PO TABS: 500.0000 mg | ORAL_TABLET | Freq: Two times a day (BID) | ORAL | 0 refills | Status: DC

## 2021-03-16 NOTE — Progress Notes (Signed)
° ° °  Subjective:  Patty Snyder is a 36 y.o. [id number] at [redacted]w[redacted]d being seen today for ongoing prenatal care.  She is currently monitored for the following issues for this high-risk pregnancy and has History of pregnancy induced hypertension; HSV-2 seropositive; Mild intermittent asthma without complication; Rubella non-immune status, antepartum; Supervision of high risk pregnancy, antepartum; ASCUS with positive high risk HPV cervical; Anxiety and depression; and Multigravida of advanced maternal age in third trimester on their problem list.  Patient reports no complaints.  Contractions: Irritability. Vag. Bleeding: None.  Movement: Present. Denies leaking of fluid.   The following portions of the patient's history were reviewed and updated as appropriate: allergies, current medications, past family history, past medical history, past social history, past surgical history and problem list. Problem list updated.  Objective:   Vitals:   03/16/21 0927  BP: 128/89  Pulse: (!) 106  Weight: (!) 383 lb 3.2 oz (173.8 kg)    Fetal Status: Fetal Heart Rate (bpm): 150   Movement: Present     General:  Alert, oriented and cooperative. Patient is in no acute distress.  Skin: Skin is warm and dry. No rash noted.   Cardiovascular: Normal heart rate noted  Respiratory: Normal respiratory effort, no problems with respiration noted  Abdomen: Soft, gravid, appropriate for gestational age. Pain/Pressure: Present     Pelvic:  Cervical exam deferred        Extremities: Normal range of motion.  Edema: Trace  Mental Status: Normal mood and affect. Normal behavior. Normal judgment and thought content.   Urinalysis:      Assessment and Plan:  Pregnancy: [id number] at [redacted]w[redacted]d  1. Supervision of high risk pregnancy, antepartum Vaginal swabs done today Reviewed last prenatal visit - discussed possible breech delivery Ultrasound tomorrow and then will need to make a plan for delivery  2. HSV-2  seropositive Reviewed with patient Found positive result in Care Everywhere even though she thought she has never had a lesion Prescribed Valtrex and discussed how to take  3. History of pregnancy induced hypertension   4. BMI 50.0-59.9, adult (HCC)   5. Engagement of fetus in breech position, single or unspecified fetus Has [place] tomorrow to determine position Will   6. [redacted] weeks gestation of pregnancy  - GC/Chlamydia probe amp ([place])not at [facility] - Culture, beta strep (group b only)  Term labor symptoms and general obstetric precautions including but not limited to vaginal bleeding, contractions, leaking of fluid and fetal movement were reviewed in detail with the patient. Please refer to After Visit Summary for other counseling recommendations.  Return in about 2 days (around 03/18/2021) for MD appointment on first available day - will need to discuss possible version and/or induction.  Nolene Bernheim, RN, MSN, NP-BC Nurse Practitioner, [facility] for [employer], [facility] 03/16/2021 1:29 PM

## 2021-03-17 ENCOUNTER — Ambulatory Visit: Payer: Medicaid Other | Admitting: *Deleted

## 2021-03-17 ENCOUNTER — Encounter: Payer: Self-pay | Admitting: *Deleted

## 2021-03-17 ENCOUNTER — Ambulatory Visit: Payer: Medicaid Other | Attending: Obstetrics

## 2021-03-17 VITALS — BP 120/78 | HR 93

## 2021-03-17 DIAGNOSIS — O09523 Supervision of elderly multigravida, third trimester: Secondary | ICD-10-CM

## 2021-03-17 DIAGNOSIS — Z6841 Body Mass Index (BMI) 40.0 and over, adult: Secondary | ICD-10-CM | POA: Diagnosis not present

## 2021-03-17 DIAGNOSIS — O0990 Supervision of high risk pregnancy, unspecified, unspecified trimester: Secondary | ICD-10-CM

## 2021-03-17 DIAGNOSIS — O09293 Supervision of pregnancy with other poor reproductive or obstetric history, third trimester: Secondary | ICD-10-CM | POA: Diagnosis not present

## 2021-03-17 DIAGNOSIS — O09299 Supervision of pregnancy with other poor reproductive or obstetric history, unspecified trimester: Secondary | ICD-10-CM | POA: Diagnosis not present

## 2021-03-17 DIAGNOSIS — Z3A37 37 weeks gestation of pregnancy: Secondary | ICD-10-CM | POA: Diagnosis not present

## 2021-03-17 DIAGNOSIS — Z8632 Personal history of gestational diabetes: Secondary | ICD-10-CM | POA: Diagnosis not present

## 2021-03-17 LAB — OB RESULTS CONSOLE GC/CHLAMYDIA: Gonorrhea: NEGATIVE

## 2021-03-17 LAB — GC/CHLAMYDIA PROBE AMP (~~LOC~~) NOT AT ARMC
Chlamydia: NEGATIVE
Comment: NEGATIVE
Comment: NORMAL
Neisseria Gonorrhea: NEGATIVE

## 2021-03-18 ENCOUNTER — Other Ambulatory Visit: Payer: Self-pay | Admitting: Advanced Practice Midwife

## 2021-03-18 ENCOUNTER — Other Ambulatory Visit (HOSPITAL_COMMUNITY): Payer: Self-pay | Admitting: *Deleted

## 2021-03-18 ENCOUNTER — Other Ambulatory Visit: Payer: Self-pay

## 2021-03-18 ENCOUNTER — Ambulatory Visit (INDEPENDENT_AMBULATORY_CARE_PROVIDER_SITE_OTHER): Payer: Medicaid Other | Admitting: Obstetrics & Gynecology

## 2021-03-18 VITALS — BP 141/57 | HR 90 | Wt 378.6 lb

## 2021-03-18 DIAGNOSIS — O0990 Supervision of high risk pregnancy, unspecified, unspecified trimester: Secondary | ICD-10-CM

## 2021-03-18 DIAGNOSIS — F419 Anxiety disorder, unspecified: Secondary | ICD-10-CM

## 2021-03-18 DIAGNOSIS — F32A Depression, unspecified: Secondary | ICD-10-CM

## 2021-03-18 DIAGNOSIS — Z8759 Personal history of other complications of pregnancy, childbirth and the puerperium: Secondary | ICD-10-CM

## 2021-03-18 NOTE — Progress Notes (Signed)
° °  PRENATAL VISIT NOTE  Subjective:  Patty Snyder is a 36 y.o. [id number] at [redacted]w[redacted]d being seen today for ongoing prenatal care.  She is currently monitored for the following issues for this high-risk pregnancy and has History of pregnancy induced hypertension; HSV-2 seropositive; Mild intermittent asthma without complication; Rubella non-immune status, antepartum; Supervision of high risk pregnancy, antepartum; ASCUS with positive high risk HPV cervical; Anxiety and depression; and Multigravida of advanced maternal age in third trimester on their problem list.  Patient reports no complaints.  Contractions: Irritability. Vag. Bleeding: None.  Movement: Present. Denies leaking of fluid.   The following portions of the patient's history were reviewed and updated as appropriate: allergies, current medications, past family history, past medical history, past social history, past surgical history and problem list.   Objective:   Vitals:   03/18/21 0922  BP: (!) 141/57  Pulse: 90  Weight: (!) 378 lb 9.6 oz (171.7 kg)    Fetal Status: Fetal Heart Rate (bpm): 143   Movement: Present     General:  Alert, oriented and cooperative. Patient is in no acute distress.  Skin: Skin is warm and dry. No rash noted.   Cardiovascular: Normal heart rate noted  Respiratory: Normal respiratory effort, no problems with respiration noted  Abdomen: Soft, gravid, appropriate for gestational age.  Pain/Pressure: Absent     Pelvic: Cervical exam deferred        Extremities: Normal range of motion.  Edema: None  Mental Status: Normal mood and affect. Normal behavior. Normal judgment and thought content.   Assessment and Plan:  Pregnancy: [id number] at [redacted]w[redacted]d 1. Anxiety and depression   2. History of pregnancy induced hypertension SBP 140 today  3. Supervision of high risk pregnancy, antepartum Breech by [place] today at bedside, schedule ECV tomorrow  Term labor symptoms and general obstetric precautions including  but not limited to vaginal bleeding, contractions, leaking of fluid and fetal movement were reviewed in detail with the patient. Please refer to After Visit Summary for other counseling recommendations.   Return in about 1 week (around 03/25/2021).  Future Appointments  Date Time Provider [place]  03/25/2021 10:30 AM [facility] NURSE [facility]  03/25/2021 10:45 AM [facility] US4 [facility]    Emeterio Reeve, MD

## 2021-03-19 ENCOUNTER — Encounter: Payer: Self-pay | Admitting: Nurse Practitioner

## 2021-03-19 ENCOUNTER — Observation Stay (HOSPITAL_COMMUNITY): Payer: Medicaid Other

## 2021-03-19 ENCOUNTER — Encounter (HOSPITAL_COMMUNITY): Payer: Self-pay | Admitting: Anesthesiology

## 2021-03-19 ENCOUNTER — Inpatient Hospital Stay (HOSPITAL_COMMUNITY)
Admission: AD | Admit: 2021-03-19 | Discharge: 2021-03-21 | DRG: 807 | Disposition: A | Discharge status: Home / Self Care | Payer: Medicaid Other | Attending: Obstetrics and Gynecology | Admitting: Obstetrics and Gynecology

## 2021-03-19 ENCOUNTER — Encounter (HOSPITAL_COMMUNITY): Payer: Self-pay | Admitting: *Deleted

## 2021-03-19 ENCOUNTER — Inpatient Hospital Stay (HOSPITAL_COMMUNITY)
Admission: AD | Admit: 2021-03-19 | Payer: Medicaid Other | Source: Home / Self Care | Admitting: Obstetrics & Gynecology

## 2021-03-19 ENCOUNTER — Inpatient Hospital Stay (HOSPITAL_COMMUNITY): Payer: Medicaid Other | Admitting: Anesthesiology

## 2021-03-19 DIAGNOSIS — O99214 Obesity complicating childbirth: Secondary | ICD-10-CM | POA: Diagnosis present

## 2021-03-19 DIAGNOSIS — O320XX0 Maternal care for unstable lie, not applicable or unspecified: Secondary | ICD-10-CM

## 2021-03-19 DIAGNOSIS — J4520 Mild intermittent asthma, uncomplicated: Secondary | ICD-10-CM | POA: Diagnosis not present

## 2021-03-19 DIAGNOSIS — O99820 Streptococcus B carrier state complicating pregnancy: Secondary | ICD-10-CM | POA: Diagnosis not present

## 2021-03-19 DIAGNOSIS — Z23 Encounter for immunization: Secondary | ICD-10-CM

## 2021-03-19 DIAGNOSIS — O0990 Supervision of high risk pregnancy, unspecified, unspecified trimester: Secondary | ICD-10-CM

## 2021-03-19 DIAGNOSIS — O9952 Diseases of the respiratory system complicating childbirth: Secondary | ICD-10-CM | POA: Diagnosis not present

## 2021-03-19 DIAGNOSIS — O139 Gestational [pregnancy-induced] hypertension without significant proteinuria, unspecified trimester: Secondary | ICD-10-CM

## 2021-03-19 DIAGNOSIS — R030 Elevated blood-pressure reading, without diagnosis of hypertension: Secondary | ICD-10-CM | POA: Diagnosis not present

## 2021-03-19 DIAGNOSIS — O9832 Other infections with a predominantly sexual mode of transmission complicating childbirth: Secondary | ICD-10-CM | POA: Diagnosis present

## 2021-03-19 DIAGNOSIS — O26893 Other specified pregnancy related conditions, third trimester: Secondary | ICD-10-CM | POA: Diagnosis not present

## 2021-03-19 DIAGNOSIS — Z2839 Other underimmunization status: Secondary | ICD-10-CM

## 2021-03-19 DIAGNOSIS — Z30430 Encounter for insertion of intrauterine contraceptive device: Secondary | ICD-10-CM

## 2021-03-19 DIAGNOSIS — Z20822 Contact with and (suspected) exposure to covid-19: Secondary | ICD-10-CM | POA: Diagnosis not present

## 2021-03-19 DIAGNOSIS — A6000 Herpesviral infection of urogenital system, unspecified: Secondary | ICD-10-CM | POA: Diagnosis present

## 2021-03-19 DIAGNOSIS — O99824 Streptococcus B carrier state complicating childbirth: Secondary | ICD-10-CM | POA: Diagnosis present

## 2021-03-19 DIAGNOSIS — O09523 Supervision of elderly multigravida, third trimester: Secondary | ICD-10-CM | POA: Diagnosis present

## 2021-03-19 DIAGNOSIS — Z975 Presence of (intrauterine) contraceptive device: Secondary | ICD-10-CM

## 2021-03-19 DIAGNOSIS — O134 Gestational [pregnancy-induced] hypertension without significant proteinuria, complicating childbirth: Secondary | ICD-10-CM | POA: Diagnosis not present

## 2021-03-19 DIAGNOSIS — Z30014 Encounter for initial prescription of intrauterine contraceptive device: Secondary | ICD-10-CM | POA: Diagnosis not present

## 2021-03-19 DIAGNOSIS — O09899 Supervision of other high risk pregnancies, unspecified trimester: Secondary | ICD-10-CM

## 2021-03-19 DIAGNOSIS — Z3A38 38 weeks gestation of pregnancy: Secondary | ICD-10-CM

## 2021-03-19 DIAGNOSIS — O321XX0 Maternal care for breech presentation, not applicable or unspecified: Secondary | ICD-10-CM | POA: Diagnosis not present

## 2021-03-19 DIAGNOSIS — O24429 Gestational diabetes mellitus in childbirth, unspecified control: Secondary | ICD-10-CM | POA: Diagnosis not present

## 2021-03-19 HISTORY — DX: Gestational (pregnancy-induced) hypertension without significant proteinuria, unspecified trimester: O13.9

## 2021-03-19 LAB — CBC
HCT: 36.2 % (ref 36.0–46.0)
HCT: 38.1 % (ref 36.0–46.0)
Hemoglobin: 12.1 g/dL (ref 12.0–15.0)
Hemoglobin: 12.6 g/dL (ref 12.0–15.0)
MCH: 30.2 pg (ref 26.0–34.0)
MCH: 29.8 pg (ref 26.0–34.0)
MCHC: 33.4 g/dL (ref 30.0–36.0)
MCHC: 33.1 g/dL (ref 30.0–36.0)
MCV: 90.3 fL (ref 80.0–100.0)
MCV: 90.1 fL (ref 80.0–100.0)
Platelets: 163 10*3/uL (ref 150–400)
Platelets: 167 10*3/uL (ref 150–400)
RBC: 4.01 MIL/uL (ref 3.87–5.11)
RBC: 4.23 MIL/uL (ref 3.87–5.11)
RDW: 13.9 % (ref 11.5–15.5)
RDW: 14 % (ref 11.5–15.5)
WBC: 8.2 10*3/uL (ref 4.0–10.5)
WBC: 10.1 10*3/uL (ref 4.0–10.5)
nRBC: 0 % (ref 0.0–0.2)
nRBC: 0 % (ref 0.0–0.2)

## 2021-03-19 LAB — RESP PANEL BY RT-PCR (FLU A&B, COVID) ARPGX2
Influenza A by PCR: NEGATIVE
Influenza B by PCR: NEGATIVE
SARS Coronavirus 2 by RT PCR: NEGATIVE

## 2021-03-19 LAB — TYPE AND SCREEN
ABO/RH(D): O POS
Antibody Screen: NEGATIVE

## 2021-03-19 LAB — RPR: RPR Ser Ql: NONREACTIVE

## 2021-03-19 LAB — CULTURE, BETA STREP (GROUP B ONLY): Strep Gp B Culture: POSITIVE — AB

## 2021-03-19 MED ORDER — SOD CITRATE-CITRIC ACID 500-334 MG/5ML PO SOLN: 30.0000 mL | ORAL | Status: DC | PRN

## 2021-03-19 MED ORDER — DIPHENHYDRAMINE HCL 50 MG/ML IJ SOLN: 12.5000 mg | INTRAMUSCULAR | Status: DC | PRN

## 2021-03-19 MED ORDER — EPHEDRINE 5 MG/ML INJ: 10.0000 mg | INTRAVENOUS | Status: DC | PRN

## 2021-03-19 MED ORDER — ACETAMINOPHEN 325 MG PO TABS: 650.0000 mg | ORAL_TABLET | ORAL | Status: DC | PRN

## 2021-03-19 MED ORDER — OXYCODONE-ACETAMINOPHEN 5-325 MG PO TABS: 2.0000 | ORAL_TABLET | ORAL | Status: DC | PRN

## 2021-03-19 MED ORDER — OXYTOCIN-SODIUM CHLORIDE 30-0.9 UT/500ML-% IV SOLN: 2.5000 [IU]/h | INTRAVENOUS | Status: DC

## 2021-03-19 MED ORDER — PHENYLEPHRINE 40 MCG/ML (10ML) SYRINGE FOR IV PUSH (FOR BLOOD PRESSURE SUPPORT): 80.0000 ug | PREFILLED_SYRINGE | INTRAVENOUS | Status: DC | PRN

## 2021-03-19 MED ORDER — LIDOCAINE-EPINEPHRINE (PF) 1.5 %-1:200000 IJ SOLN
INTRAMUSCULAR | Status: DC | PRN
  Administered 2021-03-19: 5 mL via EPIDURAL

## 2021-03-19 MED ORDER — TERBUTALINE SULFATE 1 MG/ML IJ SOLN: 0.2500 mg | Freq: Once | INTRAMUSCULAR | Status: DC | PRN

## 2021-03-19 MED ORDER — LACTATED RINGERS IV SOLN
INTRAVENOUS | Status: DC
  Administered 2021-03-19: 950 mL via INTRAVENOUS

## 2021-03-19 MED ORDER — OXYTOCIN BOLUS FROM INFUSION
333.0000 mL | Freq: Once | INTRAVENOUS | Status: AC
  Administered 2021-03-20: 333 mL via INTRAVENOUS

## 2021-03-19 MED ORDER — LACTATED RINGERS IV SOLN: 500.0000 mL | INTRAVENOUS | Status: DC | PRN

## 2021-03-19 MED ORDER — MISOPROSTOL 50MCG HALF TABLET
50.0000 ug | ORAL_TABLET | ORAL | Status: DC | PRN
  Administered 2021-03-19 (×2): 50 ug via BUCCAL
  Filled 2021-03-19 (×2): qty 1

## 2021-03-19 MED ORDER — SODIUM CHLORIDE 0.9 % IV SOLN
5.0000 10*6.[IU] | Freq: Once | INTRAVENOUS | Status: AC
  Administered 2021-03-19: 5 10*6.[IU] via INTRAVENOUS
  Filled 2021-03-19: qty 5

## 2021-03-19 MED ORDER — ONDANSETRON HCL 4 MG/2ML IJ SOLN: 4.0000 mg | Freq: Four times a day (QID) | INTRAMUSCULAR | Status: DC | PRN

## 2021-03-19 MED ORDER — LEVONORGESTREL 20.1 MCG/DAY IU IUD
1.0000 | INTRAUTERINE_SYSTEM | Freq: Once | INTRAUTERINE | Status: AC
  Administered 2021-03-20: 1 via INTRAUTERINE
  Filled 2021-03-19: qty 1

## 2021-03-19 MED ORDER — TERBUTALINE SULFATE 1 MG/ML IJ SOLN
INTRAMUSCULAR | Status: AC
  Filled 2021-03-19: qty 1

## 2021-03-19 MED ORDER — OXYCODONE-ACETAMINOPHEN 5-325 MG PO TABS: 1.0000 | ORAL_TABLET | ORAL | Status: DC | PRN

## 2021-03-19 MED ORDER — OXYTOCIN-SODIUM CHLORIDE 30-0.9 UT/500ML-% IV SOLN
1.0000 m[IU]/min | INTRAVENOUS | Status: DC
  Administered 2021-03-19: 2 m[IU]/min via INTRAVENOUS
  Filled 2021-03-19: qty 500

## 2021-03-19 MED ORDER — TERBUTALINE SULFATE 1 MG/ML IJ SOLN: 0.2500 mg | Freq: Once | INTRAMUSCULAR | Status: DC

## 2021-03-19 MED ORDER — LIDOCAINE HCL (PF) 1 % IJ SOLN
INTRAMUSCULAR | Status: DC | PRN
Start: 2021-03-19 — End: 2021-03-20
  Administered 2021-03-19: 5 mL via EPIDURAL

## 2021-03-19 MED ORDER — PENICILLIN G POT IN DEXTROSE 60000 UNIT/ML IV SOLN
3.0000 10*6.[IU] | INTRAVENOUS | Status: DC
  Filled 2021-03-19: qty 50

## 2021-03-19 MED ORDER — FENTANYL-BUPIVACAINE-NACL 0.5-0.125-0.9 MG/250ML-% EP SOLN
12.0000 mL/h | EPIDURAL | Status: DC | PRN
  Administered 2021-03-19: 12 mL/h via EPIDURAL
  Filled 2021-03-19: qty 250

## 2021-03-19 MED ORDER — FENTANYL CITRATE (PF) 100 MCG/2ML IJ SOLN: 50.0000 ug | INTRAMUSCULAR | Status: DC | PRN

## 2021-03-19 MED ORDER — LACTATED RINGERS IV SOLN: INTRAVENOUS | Status: DC

## 2021-03-19 MED ORDER — PENICILLIN G POT IN DEXTROSE 60000 UNIT/ML IV SOLN
3.0000 10*6.[IU] | INTRAVENOUS | Status: DC
  Administered 2021-03-19 (×3): 3 10*6.[IU] via INTRAVENOUS
  Filled 2021-03-19 (×3): qty 50

## 2021-03-19 MED ORDER — LACTATED RINGERS IV SOLN
500.0000 mL | Freq: Once | INTRAVENOUS | Status: AC
  Administered 2021-03-19: 500 mL via INTRAVENOUS

## 2021-03-19 MED ORDER — LIDOCAINE HCL (PF) 1 % IJ SOLN
30.0000 mL | INTRAMUSCULAR | Status: DC | PRN
  Filled 2021-03-19: qty 30

## 2021-03-19 MED ORDER — MISOPROSTOL 25 MCG QUARTER TABLET: 25.0000 ug | ORAL_TABLET | Freq: Once | ORAL | Status: DC

## 2021-03-19 MED ORDER — OXYTOCIN BOLUS FROM INFUSION: 333.0000 mL | Freq: Once | INTRAVENOUS | Status: DC

## 2021-03-19 MED ORDER — SODIUM CHLORIDE 0.9 % IV SOLN
5.0000 10*6.[IU] | Freq: Once | INTRAVENOUS | Status: DC
  Filled 2021-03-19: qty 5

## 2021-03-19 MED ORDER — TERBUTALINE SULFATE 1 MG/ML IJ SOLN
0.2500 mg | Freq: Once | INTRAMUSCULAR | Status: DC | PRN
Start: 2021-03-19 — End: 2021-03-20

## 2021-03-19 MED ORDER — LIDOCAINE HCL (PF) 1 % IJ SOLN: 30.0000 mL | INTRAMUSCULAR | Status: DC | PRN

## 2021-03-19 MED ORDER — OXYTOCIN-SODIUM CHLORIDE 30-0.9 UT/500ML-% IV SOLN
2.5000 [IU]/h | INTRAVENOUS | Status: DC
  Administered 2021-03-20: 2.5 [IU]/h via INTRAVENOUS

## 2021-03-19 NOTE — Progress Notes (Signed)
Labor Progress Note Patty Snyder is a 36 y.o. [id number] at [redacted]w[redacted]d presented for IOL d/t elevated BP. Vertex on BSUS (did not require scheduled ECV was breech on u/s yesterday in office)  S: Patient is resting comfortably, no complaints currently.  O:  BP 113/70    Pulse 75    Temp 98.2 F (36.8 C) (Oral)    Resp 18    Ht 6' (1.829 m)    Wt (!) 171.5 kg    LMP 06/25/2020 (Exact Date)    SpO2 99%    BMI 51.27 kg/m  EFM: FHR 150s / moderate variability / + accels, no deceks  CVE: Dilation: Fingertip Effacement (%): Thick Cervical Position: Posterior Station: Ballotable Presentation: Vertex Exam by:: Sandy Salaam, RN   A&P: 36 y.o. [id number] [redacted]w[redacted]d here for IOL d/t elevated BP #Labor: Progressing well. S/p 2 doses of buccal cytotec (last dose 1430) #Pain: Controlled currently plan for epidural #FWB: Cat I #GBS positive  #Elevated BP BP has been controlled at 113-130/61-70. Asymptomatic -monitor on serial BPs  #GBS positive -PCN   Levin Erp, MD [facility] [employer], [facility] 4:15 PM

## 2021-03-19 NOTE — Anesthesia Preprocedure Evaluation (Signed)
Anesthesia Evaluation  Patient identified by MRN, date of birth, ID band Patient awake    Reviewed: Allergy & Precautions, H&P , NPO status , Patient's Chart, lab work & pertinent test results  History of Anesthesia Complications Negative for: history of anesthetic complications  Airway Mallampati: II  TM Distance: >3 FB Neck ROM: full    Dental no notable dental hx. (+) Teeth Intact   Pulmonary asthma ,    Pulmonary exam normal breath sounds clear to auscultation       Cardiovascular hypertension, Normal cardiovascular exam Rhythm:regular Rate:Normal     Neuro/Psych PSYCHIATRIC DISORDERS Anxiety Depression negative neurological ROS     GI/Hepatic negative GI ROS, Neg liver ROS,   Endo/Other  diabetes, GestationalMorbid obesity  Renal/GU negative Renal ROS  negative genitourinary   Musculoskeletal   Abdominal (+) + obese,   Peds  Hematology negative hematology ROS (+)   Anesthesia Other Findings   Reproductive/Obstetrics (+) Pregnancy                             Anesthesia Physical Anesthesia Plan  ASA: 4  Anesthesia Plan: Epidural   Post-op Pain Management:    Induction:   PONV Risk Score and Plan:   Airway Management Planned:   Additional Equipment:   Intra-op Plan:   Post-operative Plan:   Informed Consent: I have reviewed the patients History and Physical, chart, labs and discussed the procedure including the risks, benefits and alternatives for the proposed anesthesia with the patient or authorized representative who has indicated his/her understanding and acceptance.       Plan Discussed with:   Anesthesia Plan Comments:         Anesthesia Quick Evaluation

## 2021-03-19 NOTE — Progress Notes (Addendum)
Labor Progress Note Patty Snyder is a 36 y.o. [id number] at [redacted]w[redacted]d presented for IOL d/t elevated BP. Vertex on BSUS (did not require scheduled ECV was breech on u/s yesterday in office)  S: Patient is resting comfortably, no complaints currently.  O:  BP 123/70    Pulse 84    Temp 98.2 F (36.8 C) (Oral)    Resp 18    Ht 6' (1.829 m)    Wt (!) 171.5 kg    LMP 06/25/2020 (Exact Date)    SpO2 99%    BMI 51.27 kg/m  EFM: FHR 140s-150s / moderate variability / + accels, no decels  BSUS confirmed vertex again while in room  CVE: Dilation: 3 Effacement (%): 60 Cervical Position: Posterior Station: -3 Presentation: Vertex Exam by:: Darrelyn Hillock, MD  A&P: 36 y.o. [id number] [redacted]w[redacted]d here for IOL d/t elevated BP #Labor: Progressing well dilated to 3 now. S/p 2 doses of buccal cytotec (last dose 1430) Starting pitocin (1856 @ 2 mL/hr) #Pain: Controlled currently plan for epidural #FWB: Cat I #GBS positive  #Elevated BP BP has been controlled at 123/70. Asymptomatic -monitor on serial BPs  #GBS positive -PCN  Gerrit Heck, [place] [facility] [place] 6:58 PM

## 2021-03-19 NOTE — H&P (Addendum)
OBSTETRIC ADMISSION HISTORY AND PHYSICAL  Patty Snyder is a 36 y.o. female [phone] with IUP at [redacted]w[redacted]d by LMP who initially presented today for a ECV. She was breech on BPP 2/21, however found to be cephalic upon arrival today. She then stayed for IOL due to elevated blood pressures on arrival.  She reports +FMs, No LOF, no VB, no blurry vision, headaches or peripheral edema, and RUQ pain. She plans on pumping her breast milk and feeding through the bottle. She requests hormonal IUD post placental for birth control. She received her prenatal care at  [facility]    Dating: By LMP --->  Estimated Date of Delivery: 04/01/21  Sono:    @[redacted]w[redacted]d , CWD, normal anatomy, transverse presentation head to maternal right, 267g, 52 % EFW  Prenatal History/Complications:  --Hx of pregnancy induced HTN, now currently with elevated blood pressures  --HSV-2 + --ASCUS with positive HR HPV --AMA --Anxiety and depression --BMI of 19  Past Medical History: Past Medical History:  Diagnosis Date   Asthma    last used inhaler last night   History of gestational diabetes 03/30/2019   HSV (herpes simplex virus) infection    Hypertension     Past Surgical History: Past Surgical History:  Procedure Laterality Date   OVARIAN CYST SURGERY      Obstetrical History: OB History     Gravida  10   Para  5   Term  5   Preterm      AB  4   Living  5      SAB  1   IAB  3   Ectopic      Multiple  0   Live Births  5           Social History Social History   Socioeconomic History   Marital status: Married    Spouse name: Not on file   Number of children: Not on file   Years of education: Not on file   Highest education level: Not on file  Occupational History   Not on file  Tobacco Use   Smoking status: Never   Smokeless tobacco: Never  Vaping Use   Vaping Use: Never used  Substance and Sexual Activity   Alcohol use: Not Currently   Drug use: Never   Sexual activity: Yes    Birth  control/protection: None  Other Topics Concern   Not on file  Social History Narrative   Not on file   Social Determinants of Health   Financial Resource Strain: Not on file  Food Insecurity: No Food Insecurity   Worried About Running Out of Food in the Last Year: Never true   Ran Out of Food in the Last Year: Never true  Transportation Needs: No Transportation Needs   Lack of Transportation (Medical): No   Lack of Transportation (Non-Medical): No  Physical Activity: Not on file  Stress: Not on file  Social Connections: Not on file    Family History: Family History  Problem Relation Age of Onset   Asthma Mother    Hyperlipidemia Father    Asthma Father     Allergies: Allergies  Allergen Reactions   Iodine Nausea And Vomiting   Shellfish Allergy Nausea And Vomiting    Medications Prior to Admission  Medication Sig Dispense Refill Last Dose   ADVAIR DISKUS 250-50 MCG/ACT AEPB Inhale 1 puff into the lungs daily. 1 each 3 03/17/2021   famotidine (PEPCID) 20 MG tablet Take 1 tablet (20 mg total)  by mouth 2 (two) times daily. 60 tablet 3 03/17/2021   montelukast (SINGULAIR) 10 MG tablet Take 10 mg by mouth at bedtime.   03/17/2021   polyethylene glycol powder (GLYCOLAX/MIRALAX) 17 GM/SCOOP powder Take 17 g by mouth daily as needed. (Patient taking differently: Take 17 g by mouth daily as needed for mild constipation.) 510 g 1 Past Week   Prenatal Vit-Fe Fumarate-FA (PREPLUS) 27-1 MG TABS Take 1 tablet by mouth daily. 30 tablet 8 03/17/2021   valACYclovir (VALTREX) 500 MG tablet Take 1 tablet (500 mg total) by mouth 2 (two) times daily. 60 tablet 0 03/17/2021     Review of Systems   All systems reviewed and negative except as stated in HPI  Blood pressure 113/70, pulse 75, temperature 98.2 F (36.8 C), temperature source Oral, resp. rate 18, height 6' (1.829 m), weight (!) 171.5 kg, last menstrual period 06/25/2020, SpO2 99 %, unknown if currently breastfeeding. General  appearance: no distress Lungs: clear to auscultation bilaterally Heart: regular rate and rhythm Abdomen: soft, non-tender; bowel sounds normal Extremities: Some left anterior tibial pain, no sign of DVT Presentation: vertex confirmed bu BSUS Fetal monitoring Baseline 140s, moderate variability Uterine activity Frequency: Every 5 minutes Dilation: 0.5 Effacement (%): Thick Station: Ballotable Exam by:: Merri Brunette, RN  Prenatal labs: ABO, Rh: --/--/O POS (02/23 0800) Antibody: NEG (02/23 0800) Rubella: <0.90 (08/23 1001) RPR: NON REACTIVE (02/23 0805)  HBsAg: Negative (08/23 1001)  HIV: Non Reactive (12/28 0911)  GBS: Positive/-- (02/20 1053)  1 hr Glucola 109 Genetic screening: NIPS LR, AFP negative, Horizon negative Anatomy US normal female  Prenatal Transfer Tool  Maternal Diabetes: No Genetic Screening: Normal Maternal Ultrasounds/Referrals: Normal Fetal Ultrasounds or other Referrals:  BPPs to track growth due to maternal obesity and breech presentation 2/21 Maternal Substance Abuse:  No Significant Maternal Medications:  None Significant Maternal Lab Results: Group B Strep positive  Results for orders placed or performed during the hospital encounter of 03/19/21 (from the past 24 hour(s))  Type and screen   Collection Time: 03/19/21  8:00 AM  Result Value Ref Range   ABO/RH(D) O POS    Antibody Screen NEG    Sample Expiration      03/22/2021,2359 Performed at [facility], [place] [address] [place], [place] [address]   Resp Panel by RT-PCR (Flu A&B, Covid) Nasopharyngeal Swab   Collection Time: 03/19/21  8:04 AM   Specimen: Nasopharyngeal Swab; Nasopharyngeal(NP) swabs in vial transport medium  Result Value Ref Range   SARS Coronavirus 2 by RT PCR NEGATIVE NEGATIVE   Influenza A by PCR NEGATIVE NEGATIVE   Influenza B by PCR NEGATIVE NEGATIVE  CBC   Collection Time: 03/19/21  8:05 AM  Result Value Ref Range   WBC 8.2 4.0 - 10.5 K/uL   RBC 4.01 3.87 -  5.11 MIL/uL   Hemoglobin 12.1 12.0 - 15.0 g/dL   HCT 36.2 36.0 - 46.0 %   MCV 90.3 80.0 - 100.0 fL   MCH 30.2 26.0 - 34.0 pg   MCHC 33.4 30.0 - 36.0 g/dL   RDW 13.9 11.5 - 15.5 %   Platelets 163 150 - 400 K/uL   nRBC 0.0 0.0 - 0.2 %  RPR   Collection Time: 03/19/21  8:05 AM  Result Value Ref Range   RPR Ser Ql NON REACTIVE NON REACTIVE    Patient Active Problem List   Diagnosis Date Noted   Group B Streptococcus carrier, +RV culture, currently pregnant 03/19/2021  Breech presentation 03/19/2021   Indication for care in labor and delivery, antepartum 03/19/2021   Gestational hypertension 03/19/2021   Unstable lie of fetus    Multigravida of advanced maternal age in third trimester 02/09/2021   Anxiety and depression 11/12/2020   ASCUS with positive high risk HPV cervical 09/16/2020   Supervision of high risk pregnancy, antepartum 08/11/2020   History of pregnancy induced hypertension 03/30/2019   Mild intermittent asthma without complication [id number]   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 [id number] at [redacted]w[redacted]d here initially for ECV due to breech presentation, however found to be cephalic. Now IOL due to elevated blood pressures.  #Labor: IOL starting with cytotec (first dose 0945) #Pain: Plan for epidural  #FWB: Cat I #ID: GBS + #MOF: Bottle (pumped breast milk) #MOC:Postpartum IUD #Circ:  N/A  #Unstable lie: Breech on [id number], cephalic today without ECV. Will monitor closely.   # Elevated BP: BP 141/84 on arrival.  Asymptomatic. Will obtain baseline pre-e labs.  -serial BPs  #HSV-2: Seropositive only, denies any vaginal outbreak in the past. Has been on valtrex. Exam without lesions.   # ASCUS with positive HR HPV -Needs postpartum pap  Gerrit Heck, MD  03/19/2021, 3:57 PM

## 2021-03-19 NOTE — Anesthesia Procedure Notes (Signed)
Epidural Patient location during procedure: OB Start time: 03/19/2021 10:30 PM End time: 03/19/2021 10:45 PM  Staffing Anesthesiologist: Leonides Grills, MD Performed: anesthesiologist   Preanesthetic Checklist Completed: patient identified, IV checked, site marked, risks and benefits discussed, monitors and equipment checked, pre-op evaluation and timeout performed  Epidural Patient position: sitting Prep: DuraPrep Patient monitoring: heart rate, cardiac monitor, continuous pulse ox and blood pressure Approach: midline Location: L2-L3 Injection technique: LOR air  Needle:  Needle type: Tuohy  Needle gauge: 17 G Needle length: 9 cm Needle insertion depth: 10 cm Catheter type: closed end flexible Catheter size: 19 Gauge Catheter at skin depth: 17 cm Test dose: negative and 1.5% lidocaine with Epi 1:200 K  Assessment Events: blood not aspirated, injection not painful, no injection resistance and negative IV test  Additional Notes Informed consent obtained prior to proceeding including risk of failure, 1% risk of PDPH, risk of minor discomfort and bruising. Discussed alternatives to epidural analgesia and patient desires to proceed.  Loss of resistance encountered on the third attempt. Timeout performed pre-procedure verifying patient name, procedure, and platelet count. Patient tolerated procedure well. Reason for block:procedure for pain

## 2021-03-19 NOTE — Progress Notes (Signed)
Vtx by BSUS 

## 2021-03-20 ENCOUNTER — Encounter (HOSPITAL_COMMUNITY): Payer: Self-pay | Admitting: Obstetrics & Gynecology

## 2021-03-20 DIAGNOSIS — Z975 Presence of (intrauterine) contraceptive device: Secondary | ICD-10-CM

## 2021-03-20 DIAGNOSIS — Z3A38 38 weeks gestation of pregnancy: Secondary | ICD-10-CM

## 2021-03-20 DIAGNOSIS — O9832 Other infections with a predominantly sexual mode of transmission complicating childbirth: Secondary | ICD-10-CM

## 2021-03-20 DIAGNOSIS — O134 Gestational [pregnancy-induced] hypertension without significant proteinuria, complicating childbirth: Secondary | ICD-10-CM

## 2021-03-20 DIAGNOSIS — O99820 Streptococcus B carrier state complicating pregnancy: Secondary | ICD-10-CM

## 2021-03-20 DIAGNOSIS — Z30014 Encounter for initial prescription of intrauterine contraceptive device: Secondary | ICD-10-CM

## 2021-03-20 MED ORDER — DIPHENHYDRAMINE HCL 25 MG PO CAPS
25.0000 mg | ORAL_CAPSULE | Freq: Four times a day (QID) | ORAL | Status: DC | PRN
Start: 2021-03-20 — End: 2021-03-21

## 2021-03-20 MED ORDER — SIMETHICONE 80 MG PO CHEW: 80.0000 mg | CHEWABLE_TABLET | ORAL | Status: DC | PRN

## 2021-03-20 MED ORDER — TRANEXAMIC ACID-NACL 1000-0.7 MG/100ML-% IV SOLN
INTRAVENOUS | Status: AC
  Administered 2021-03-20: 1000 mg
  Filled 2021-03-20: qty 100

## 2021-03-20 MED ORDER — MEASLES, MUMPS & RUBELLA VAC IJ SOLR
0.5000 mL | Freq: Once | INTRAMUSCULAR | Status: AC
  Administered 2021-03-21: 0.5 mL via SUBCUTANEOUS
  Filled 2021-03-20: qty 0.5

## 2021-03-20 MED ORDER — ONDANSETRON HCL 4 MG PO TABS: 4.0000 mg | ORAL_TABLET | ORAL | Status: DC | PRN

## 2021-03-20 MED ORDER — WITCH HAZEL-GLYCERIN EX PADS: 1.0000 "application " | MEDICATED_PAD | CUTANEOUS | Status: DC | PRN

## 2021-03-20 MED ORDER — DIBUCAINE (PERIANAL) 1 % EX OINT: 1.0000 "application " | TOPICAL_OINTMENT | CUTANEOUS | Status: DC | PRN

## 2021-03-20 MED ORDER — COCONUT OIL OIL
1.0000 "application " | TOPICAL_OIL | Status: DC | PRN
  Administered 2021-03-20: 1 via TOPICAL

## 2021-03-20 MED ORDER — IBUPROFEN 600 MG PO TABS
600.0000 mg | ORAL_TABLET | Freq: Four times a day (QID) | ORAL | Status: DC
  Administered 2021-03-20 – 2021-03-21 (×6): 600 mg via ORAL
  Filled 2021-03-20 (×6): qty 1

## 2021-03-20 MED ORDER — PRENATAL MULTIVITAMIN CH
1.0000 | ORAL_TABLET | Freq: Every day | ORAL | Status: DC
  Administered 2021-03-20 – 2021-03-21 (×2): 1 via ORAL
  Filled 2021-03-20 (×2): qty 1

## 2021-03-20 MED ORDER — BENZOCAINE-MENTHOL 20-0.5 % EX AERO
1.0000 "application " | INHALATION_SPRAY | CUTANEOUS | Status: DC | PRN
  Administered 2021-03-20: 1 via TOPICAL
  Filled 2021-03-20: qty 56

## 2021-03-20 MED ORDER — ONDANSETRON HCL 4 MG/2ML IJ SOLN: 4.0000 mg | INTRAMUSCULAR | Status: DC | PRN

## 2021-03-20 MED ORDER — SENNOSIDES-DOCUSATE SODIUM 8.6-50 MG PO TABS
2.0000 | ORAL_TABLET | Freq: Every day | ORAL | Status: DC
  Filled 2021-03-20: qty 2

## 2021-03-20 MED ORDER — ACETAMINOPHEN 325 MG PO TABS
650.0000 mg | ORAL_TABLET | ORAL | Status: DC | PRN
  Administered 2021-03-20: 650 mg via ORAL
  Filled 2021-03-20: qty 2

## 2021-03-20 NOTE — Lactation Note (Signed)
This note was copied from a baby's chart. Lactation Consultation Note Mom declined Lactation services.  Patient Name: Patty Snyder [id number] Date: 03/20/2021   Age:36 hours  Maternal Data    Feeding    LATCH Score                    Lactation Tools Discussed/Used    Interventions    Discharge    Consult Status Consult Status: Complete    Patty Snyder G 03/20/2021, 2:00 AM

## 2021-03-20 NOTE — Lactation Note (Signed)
This note was copied from a baby's chart. Lactation Consultation Note  Patient Name: Girl Mikaelyn Arthurs [id number] Date: 03/20/2021 Reason for consult: 1st time breastfeeding;Early term 37-38.6wks;Initial assessment;Mother's request;Breastfeeding assistance Age:36 hours   P6 mother whose infant is now 58 hours old.  This is an ETI at 38+2 weeks.  Mother did not breast feed any of her other children.  Her original feeding plan was to pump and bottle feed, however, she has now decided to attempt latching.  Baby was asleep in mother's arms when I arrived.  Reviewed breast feeding basics.  Encouraged to feed 8-12 times/24 hours or sooner if baby shows cues.  Suggested lots of STS and calling for latch assistance when she is ready to feed.  Mother has a DEBP set up at bedside but is unfamiliar with how to check flange size.  Suggested she call when baby is ready to feed and lactation can review hand expression and observe pumping.  Mother appreciative.   Support person present and asleep on the couch.  RN updated.  Mother is obtaining a DEBP from her insurance company.    Maternal Data Has patient been taught Hand Expression?: No Does the patient have breastfeeding experience prior to this delivery?: No  Feeding Mother's Current Feeding Choice: Breast Milk and Formula  LATCH Score                    Lactation Tools Discussed/Used    Interventions Interventions: Breast feeding basics reviewed  Discharge Pump: Personal (Obtaining her pump from insurance company)  Consult Status Consult Status: Follow-up Date: 03/20/21 Follow-up type: In-patient    Dora Sims 03/20/2021, 7:33 AM

## 2021-03-20 NOTE — Discharge Summary (Addendum)
Postpartum Discharge Summary     Patient Name: Patty Snyder DOB: 11/30/1985 MRN: [id number]  Date of admission: 03/19/2021 Delivery date:03/20/2021  Delivering provider: Christin Fudge  Date of discharge: 03/21/2021  Admitting diagnosis: Breech presentation [O32.1XX0] Indication for care in labor and delivery, antepartum [O75.9] Gestational hypertension [O13.9] Intrauterine pregnancy: [redacted]w[redacted]d     Secondary diagnosis:  Principal Problem:   Breech presentation Active Problems:   Rubella non-immune status, antepartum   Multigravida of advanced maternal age in third trimester   Group B Streptococcus carrier, +RV culture, currently pregnant   Vaginal delivery   Unstable lie of fetus   IUD (intrauterine device) in place  Additional problems: elevated BP w/o dx of HTN    Discharge diagnosis: Term Pregnancy Delivered                                              Post partum procedures: postplacenal IUD placement Augmentation: AROM, Pitocin, Cytotec, and IP Foley Complications: None  Hospital course: Induction of Labor With Vaginal Delivery   36 y.o. yo [id number] at [redacted]w[redacted]d was admitted to the hospital 03/19/2021 for induction of labor.  Indication for induction: Unstable lie- cephalic on presentation and didn't require an ECV.  Patient had an uncomplicated labor course as follows: Membrane Rupture Time/Date: 12:54 AM ,03/20/2021   Delivery Method:Vaginal, Spontaneous  Episiotomy: None  Lacerations:  None  Details of delivery can be found in separate delivery note.  Patient had a postpartum course remarkable for the addition of postplacental IUD placement (see note). Her BPs were borderline 130s/80s and so Lasix $Remove'20mg'MuQeImd$  qd x 5d was started prior to d/c. Patient is discharged home 03/21/21.  Newborn Data: Birth date:03/20/2021  Birth time:12:59 AM  Gender:Female  Living status:Living  Apgars:9 ,9  Weight:2940 g (6lb 7.7oz)  Magnesium Sulfate received: No BMZ received:  No Rhophylac:N/A MMR: recommended prior to d/c T-DaP:Given prenatally Flu: Yes Transfusion:No  Physical exam  Vitals:   03/20/21 0830 03/20/21 1200 03/20/21 2156 03/21/21 0618  BP: 130/72 134/76 131/73 129/80  Pulse: 76 85 87 82  Resp: $Remo'20 18 18 18  'PfmzS$ Temp: 98.4 F (36.9 C) 98.5 F (36.9 C) 98 F (36.7 C) 99 F (37.2 C)  TempSrc: Oral Oral Oral Oral  SpO2:   98% 99%  Weight:      Height:       General: alert and cooperative Lochia: appropriate Uterine Fundus: firm Incision: N/A DVT Evaluation: No evidence of DVT seen on physical exam. Labs: Lab Results  Component Value Date   WBC 10.1 03/19/2021   HGB 12.6 03/19/2021   HCT 38.1 03/19/2021   MCV 90.1 03/19/2021   PLT 167 03/19/2021   CMP Latest Ref Rng & Units 08/09/2019  Glucose 70 - 99 mg/dL 99  BUN 6 - 20 mg/dL 10  Creatinine 0.44 - 1.00 mg/dL 0.45  Sodium 135 - 145 mmol/L 135  Potassium 3.5 - 5.1 mmol/L 3.6  Chloride 98 - 111 mmol/L 106  CO2 22 - 32 mmol/L 20(L)  Calcium 8.9 - 10.3 mg/dL 8.9  Total Protein 6.5 - 8.1 g/dL 6.0(L)  Total Bilirubin 0.3 - 1.2 mg/dL 0.5  Alkaline Phos 38 - 126 U/L 35(L)  AST 15 - 41 U/L 14(L)  ALT 0 - 44 U/L 11   Edinburgh Score: Edinburgh Postnatal Depression Scale Screening Tool 03/20/2021  I have been able to  laugh and see the funny side of things. 0  I have looked forward with enjoyment to things. 0  I have blamed myself unnecessarily when things went wrong. 0  I have been anxious or worried for no good reason. 0  I have felt scared or panicky for no good reason. 0  Things have been getting on top of me. 0  I have been so unhappy that I have had difficulty sleeping. 0  I have felt sad or miserable. 0  I have been so unhappy that I have been crying. 0  The thought of harming myself has occurred to me. 0  Edinburgh Postnatal Depression Scale Total 0     After visit meds:  Allergies as of 03/21/2021       Reactions   Iodine Nausea And Vomiting   Shellfish Allergy  Nausea And Vomiting        Medication List     STOP taking these medications    famotidine 20 MG tablet Commonly known as: PEPCID   valACYclovir 500 MG tablet Commonly known as: Valtrex       TAKE these medications    Advair Diskus 250-50 MCG/ACT Aepb Generic drug: fluticasone-salmeterol Inhale 1 puff into the lungs daily.   furosemide 20 MG tablet Commonly known as: LASIX Take 1 tablet (20 mg total) by mouth daily.   ibuprofen 600 MG tablet Commonly known as: ADVIL Take 1 tablet (600 mg total) by mouth every 6 (six) hours as needed.   montelukast 10 MG tablet Commonly known as: SINGULAIR Take 10 mg by mouth at bedtime.   polyethylene glycol powder 17 GM/SCOOP powder Commonly known as: GLYCOLAX/MIRALAX Take 17 g by mouth daily as needed. What changed: reasons to take this   PrePLUS 27-1 MG Tabs Take 1 tablet by mouth daily.         Discharge home in stable condition Infant Feeding: Bottle Infant Disposition:home with mother Discharge instruction: per After Visit Summary and Postpartum booklet. Activity: Advance as tolerated. Pelvic rest for 6 weeks.  Diet: routine diet Future Appointments: Future Appointments  Date Time Provider [place]  04/17/2021 10:35 AM Constant, Vickii Chafe, MD [facility]   Follow up Visit:   Please schedule this patient for a In person postpartum visit in 4 weeks with the following provider: Any provider. Additional Postpartum F/U: IUD string check  ; BP check in 1 week Low risk pregnancy complicated by:  Delivery mode:  Vaginal, Spontaneous  Anticipated Birth Control:  PP IUD placed   03/21/2021 Myrtis Ser, CNM 9:00 AM

## 2021-03-20 NOTE — Lactation Note (Addendum)
This note was copied from a baby's chart. Lactation Consultation Note  Patient Name: Patty Snyder [id number] Date: 03/20/2021 Reason for consult: Follow-up assessment;Mother's request;Nipple pain/trauma;Early term 37-38.6wks;Breastfeeding assistance Age:36 hours  Infant latched with assistance of RN by the time [place] arrived. Mom complaining of nipple pain with use of the dEBP size 27 flange. We increased to size 30 she states is a better fit. RN to provide coconut oil to use prior to pumping for nipple care.  Nipples intact just some sloughing of skin with smaller flange. Mom understands with pumping nipple size can change to adjust accordingly.   Mom to call for latch assistance with next feeding.  Plan 1.to feed based on cues 8-12x 24hr period.  2. Mom to supplement with eBM via slow flow nipple and pace bottle feeding 5-7 ml  3. DEBP q 3hrs for 15 min  All questions answered at the end of the visit.   Maternal Data    Feeding Mother's Current Feeding Choice: Breast Milk Nipple Type: Slow - flow  LATCH Score Latch: Grasps breast easily, tongue down, lips flanged, rhythmical sucking.  Audible Swallowing: A few with stimulation  Type of Nipple: Everted at rest and after stimulation  Comfort (Breast/Nipple): Soft / non-tender  Hold (Positioning): Assistance needed to correctly position infant at breast and maintain latch.  LATCH Score: 8   Lactation Tools Discussed/Used Tools: Pump;Flanges;Coconut oil Flange Size: 30 Breast pump type: Double-Electric Breast Pump Pump Education: Setup, frequency, and cleaning;Milk Storage Reason for Pumping: increase stimulation Pumping frequency: every 3 hrs for 15 min  Interventions Interventions: Breast feeding basics reviewed;Expressed milk;Coconut oil;DEBP;Education;Pace feeding;Tour manager education  Discharge    Consult Status Consult Status: Follow-up Date: 03/21/21 Follow-up type:  In-patient    Cheryle Dark  [place] 03/20/2021, 12:40 PM

## 2021-03-20 NOTE — Procedures (Signed)
°  Post-Placental IUD Insertion Procedure Note  Patient identified, informed consent signed prior to delivery, signed copy in chart, time out was performed.    Vaginal, labial and perineal areas thoroughly inspected for lacerations. no laceration identified -   Liletta IUD  inserted with inserter per manufacturer's instructions.    Strings trimmed to the level of the introitus. Patient tolerated procedure well.   Patient given post procedure instructions and IUD care card with expiration date.  Patient is asked to keep IUD strings tucked in her vagina until her postpartum follow up visit in 4-6 weeks. Patient advised to abstain from sexual intercourse and pulling on strings before her follow-up visit. Patient verbalized an understanding of the plan of care and agrees.

## 2021-03-20 NOTE — Anesthesia Postprocedure Evaluation (Signed)
Anesthesia Post Note  Patient: Patty Snyder  Procedure(s) Performed: AN AD HOC LABOR EPIDURAL     Patient location during evaluation: Mother Baby Anesthesia Type: Epidural Level of consciousness: awake and alert Pain management: pain level controlled Vital Signs Assessment: post-procedure vital signs reviewed and stable Respiratory status: spontaneous breathing, nonlabored ventilation and respiratory function stable Cardiovascular status: stable Postop Assessment: no headache, no backache, epidural receding, no apparent nausea or vomiting, patient able to bend at knees, adequate PO intake and able to ambulate Anesthetic complications: no   No notable events documented.  Last Vitals:  Vitals:   03/20/21 0410 03/20/21 0830  BP: 124/86 130/72  Pulse: 81 76  Resp: 17 20  Temp: 36.6 C 36.9 C  SpO2: 100%     Last Pain:  Vitals:   03/20/21 0830  TempSrc: Oral  PainSc: 0-No pain   Pain Goal:                   [place] [employer]

## 2021-03-20 NOTE — Social Work (Signed)
CSW received consult for hx of Depression and Anxiety. CSW met with MOB to offer support and complete assessment.     CSW introduced self and role. CSW observed MOB performing skin to skin with infant 'Aniya'. CSW informed MOB of the reason for consult and assessed current mood. MOB was engaged and pleasant as she shared she is doing fine. MOB denies having a history of anxiety and depression. MOB laughed as she shared that she experienced some tearfulness during prenatal appointments due to already have a 67-monthold baby. MOB want on to state that things became better and she is not experiencing any mental health symptoms. MOB denies any mental health history. MOB reported FOB will be the primary support postpartum due to family living elsewhere. MOB denies any current SI, HI or being involved in DV.   CSW discussed the baby blues period versus PPD and provided MOB with mental health resources. MOB shared that she has not experienced PP with any of her other children. CSW also provided MOB with the New Mom Checklist and encouraged her to self evaluate. CSW reviewed Sudden Infant Death Syndrome (SIDS) education with MOB. MOB reported infant will sleep in a car seat. MOB stated she has a car seat and all essential needs. [place] and denies any barriers to care. MOB denies having any additional stressors or needs at this time.   CSW identifies no further need for intervention and no barriers to discharge at this time.  CDarra Lis LColdstreamWork WEnterprise Productsand CMolson [employer]ors [employer]([phone]

## 2021-03-21 ENCOUNTER — Encounter (HOSPITAL_COMMUNITY): Payer: Self-pay | Admitting: Obstetrics & Gynecology

## 2021-03-21 MED ORDER — IBUPROFEN 600 MG PO TABS: 600.0000 mg | ORAL_TABLET | Freq: Four times a day (QID) | ORAL | 0 refills | Status: AC | PRN

## 2021-03-21 MED ORDER — FUROSEMIDE 20 MG PO TABS
20.0000 mg | ORAL_TABLET | Freq: Every day | ORAL | Status: DC
  Administered 2021-03-21: 20 mg via ORAL
  Filled 2021-03-21: qty 1

## 2021-03-21 MED ORDER — MEASLES, MUMPS & RUBELLA VAC IJ SOLR: 0.5000 mL | Freq: Once | INTRAMUSCULAR | Status: DC

## 2021-03-21 MED ORDER — FUROSEMIDE 20 MG PO TABS: 20.0000 mg | ORAL_TABLET | Freq: Every day | ORAL | 0 refills | Status: DC

## 2021-03-21 NOTE — Lactation Note (Signed)
This note was copied from a baby's chart. Lactation Consultation Note  Patient Name: Patty Snyder [id number] Date: 03/21/2021   Age:36 hours  Mother declined lactation services confirmed by Teena Irani RN.   Consult Status Consult Status: Complete (mother declined follow up)    Hardie Pulley 03/21/2021, 8:42 AM

## 2021-03-25 ENCOUNTER — Other Ambulatory Visit: Payer: Medicaid Other

## 2021-03-25 ENCOUNTER — Ambulatory Visit: Payer: Medicaid Other

## 2021-03-25 DIAGNOSIS — Z419 Encounter for procedure for purposes other than remedying health state, unspecified: Secondary | ICD-10-CM | POA: Diagnosis not present

## 2021-03-26 ENCOUNTER — Encounter: Payer: Medicaid Other | Admitting: Advanced Practice Midwife

## 2021-03-30 ENCOUNTER — Ambulatory Visit: Payer: Medicaid Other

## 2021-03-31 ENCOUNTER — Telehealth (HOSPITAL_COMMUNITY): Payer: Self-pay | Admitting: *Deleted

## 2021-03-31 NOTE — Telephone Encounter (Signed)
Patient reports increased swelling in her lower extremities. Denied shortness of breath, headaches, RUQ abdominal pain. Instructed patient to call 911 for shortness of breath. Instructed patient to notify MD tomorrow of observed lower extremity swelling. Patient verbalized understanding. Patient voiced no other questions or concerns at this time. EPDS=0. Patient voiced no questions or concerns regarding infant at this time. Patient reports infant sleeps in a bassinet on her back. RN reviewed ABCs of safe sleep. Patient verbalized understanding. Patient requested RN email information on hospital's virtual postpartum classes and support groups. Email sent. Deforest Hoyles, RN, 03/31/21, 2001  ?

## 2021-04-02 ENCOUNTER — Ambulatory Visit (HOSPITAL_COMMUNITY)
Admission: EM | Admit: 2021-04-02 | Discharge: 2021-04-02 | Disposition: A | Discharge status: Home / Self Care | Payer: Medicaid Other | Attending: Family Medicine | Admitting: Family Medicine

## 2021-04-02 ENCOUNTER — Other Ambulatory Visit: Payer: Self-pay

## 2021-04-02 ENCOUNTER — Encounter (HOSPITAL_COMMUNITY): Payer: Self-pay | Admitting: Emergency Medicine

## 2021-04-02 DIAGNOSIS — Z20822 Contact with and (suspected) exposure to covid-19: Secondary | ICD-10-CM | POA: Insufficient documentation

## 2021-04-02 DIAGNOSIS — R609 Edema, unspecified: Secondary | ICD-10-CM | POA: Diagnosis not present

## 2021-04-02 DIAGNOSIS — J069 Acute upper respiratory infection, unspecified: Secondary | ICD-10-CM | POA: Diagnosis not present

## 2021-04-02 DIAGNOSIS — I10 Essential (primary) hypertension: Secondary | ICD-10-CM | POA: Diagnosis present

## 2021-04-02 LAB — SARS CORONAVIRUS 2 (TAT 6-24 HRS): SARS Coronavirus 2: NEGATIVE

## 2021-04-02 NOTE — ED Provider Notes (Addendum)
?[facility] ? ? ? ?CSN: [id number] ?Arrival date & time: 04/02/21  [id number] ? ? ?  ? ?History   ?Chief Complaint ?Chief Complaint  ?Patient presents with  ? Hypertension  ?  Delivered baby 2 weeks ago  ? Cough  ? ? ?HPI ?Park PopeDonna Snyder is a 36 y.o. female.  ? ? ?Hypertension ? ?Cough ?Here for h/a that began 3/6. The h/a is improved now.  ? ? ?Now has a cough and congestion. Symptoms began 3/8. No fever so far, and no new wheezing. Does have a h/o asthma. ? ?Has a lot of swelling. Delivered her baby on 2/23. Is out of lasix. No abdominal pain. She is nursing ? ? ? ? ?Past Medical History:  ?Diagnosis Date  ? Asthma   ? last used inhaler last night  ? Gestational hypertension 03/19/2021  ? History of gestational diabetes 03/30/2019  ? HSV (herpes simplex virus) infection   ? Hypertension   ? ? ?Patient Active Problem List  ? Diagnosis Date Noted  ? IUD (intrauterine device) in place 03/20/2021  ? Group B Streptococcus carrier, +RV culture, currently pregnant 03/19/2021  ? Breech presentation 03/19/2021  ? Vaginal delivery 03/19/2021  ? Unstable lie of fetus   ? Multigravida of advanced maternal age in third trimester 02/09/2021  ? Anxiety and depression 11/12/2020  ? ASCUS with positive high risk HPV cervical 09/16/2020  ? Supervision of high risk pregnancy, antepartum 08/11/2020  ? History of pregnancy induced hypertension 03/30/2019  ? Mild intermittent asthma without complication 07/15/2016  ? 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 Rubella non-immune status, antepartum 09/11/2015  ? ? ?Past Surgical History:  ?Procedure Laterality Date  ? OVARIAN CYST SURGERY    ? ? ?OB History   ? ? Gravida  ?10  ? Para  ?6  ? Term  ?6  ? Preterm  ?   ? AB  ?4  ? Living  ?6  ?  ? ? SAB  ?1  ? IAB  ?3  ? Ectopic  ?   ? Multiple  ?0  ? Live Births  ?6  ?   ?  ?  ? ? ? ?Home Medications   ? ?Prior to Admission medications   ?Medication Sig Start Date End Date Taking? Authorizing Provider  ?ADVAIR DISKUS 250-50 MCG/ACT AEPB Inhale 1 puff  into the lungs daily. 02/09/21  Yes [place] BingPickens, Charlie, MD  ?furosemide (LASIX) 20 MG tablet Take 1 tablet (20 mg total) by mouth daily. 03/21/21  Yes Arabella MerlesShaw, Kimberly D, CNM  ?Prenatal Vit-Fe Fumarate-FA (PREPLUS) 27-1 MG TABS Take 1 tablet by mouth daily. 08/04/20  Yes Marny LowensteinWenzel, Julie N, PA-C  ?ibuprofen (ADVIL) 600 MG tablet Take 1 tablet (600 mg total) by mouth every 6 (six) hours as needed. 03/21/21   Arabella MerlesShaw, Kimberly D, CNM  ?montelukast (SINGULAIR) 10 MG tablet Take 10 mg by mouth at bedtime. 03/16/21   [provider]  ?polyethylene glycol powder (GLYCOLAX/MIRALAX) 17 GM/SCOOP powder Take 17 g by mouth daily as needed. ?Patient taking differently: Take 17 g by mouth daily as needed for mild constipation. 11/12/20   Venora MaplesEckstat, Matthew M, MD  ? ? ?Family History ?Family History  ?Problem Relation Age of Onset  ? Asthma Mother   ? Hyperlipidemia Father   ? Asthma Father   ? ? ?Social History ?Social History  ? ?Tobacco Use  ? Smoking status: Never  ? Smokeless tobacco: Never  ?Vaping Use  ? Vaping Use: Never used  ?Substance Use Topics  ?  Alcohol use: Not Currently  ? Drug use: Never  ? ? ? ?Allergies   ?Iodine and Shellfish allergy ? ? ?Review of Systems ?Review of Systems  ?Respiratory:  Positive for cough.   ? ? ?Physical Exam ?Triage Vital Signs ?ED Triage Vitals [04/02/21 0922]  ?Enc Vitals Group  ?   BP (!) 134/91  ?   Pulse Rate 97  ?   Resp 18  ?   Temp 98.6 ?F (37 ?C)  ?   Temp Source Oral  ?   SpO2 99 %  ?   Weight   ?   Height   ?   Head Circumference   ?   Peak Flow   ?   Pain Score   ?   Pain Loc   ?   Pain Edu?   ?   Excl. in GC?   ? ?No data found. ? ?Updated Vital Signs ?BP (!) 134/91   Pulse 97   Temp 98.6 ?F (37 ?C) (Oral)   Resp 18   LMP 06/25/2020 (Exact Date)   SpO2 99%   Breastfeeding Yes  ? ?Visual Acuity ?Right Eye Distance:   ?Left Eye Distance:   ?Bilateral Distance:   ? ?Right Eye Near:   ?Left Eye Near:    ?Bilateral Near:    ? ?Physical Exam ?Vitals reviewed.  ?Constitutional:   ?    General: She is not in acute distress. ?   Appearance: She is not ill-appearing, toxic-appearing or diaphoretic.  ?HENT:  ?   Mouth/Throat:  ?   Mouth: Mucous membranes are moist.  ?   Pharynx: No oropharyngeal exudate or posterior oropharyngeal erythema.  ?Eyes:  ?   Extraocular Movements: Extraocular movements intact.  ?   Conjunctiva/sclera: Conjunctivae normal.  ?   Pupils: Pupils are equal, round, and reactive to light.  ?Cardiovascular:  ?   Rate and Rhythm: Normal rate and regular rhythm.  ?   Heart sounds: No murmur heard. ?Pulmonary:  ?   Effort: Pulmonary effort is normal.  ?   Breath sounds: Normal breath sounds. No wheezing or rhonchi.  ?Musculoskeletal:  ?   Cervical back: Neck supple.  ?   Right lower leg: Edema present.  ?   Left lower leg: Edema present.  ?Lymphadenopathy:  ?   Cervical: No cervical adenopathy.  ?Skin: ?   Coloration: Skin is not jaundiced or pale.  ?Neurological:  ?   General: No focal deficit present.  ?   Mental Status: She is alert and oriented to person, place, and time.  ?Psychiatric:     ?   Behavior: Behavior normal.  ? ? ? ?UC Treatments / Results  ?Labs ?(all labs ordered are listed, but only abnormal results are displayed) ?Labs Reviewed - No data to display ? ?EKG ? ? ?Radiology ?No results found. ? ?Procedures ?Procedures (including critical care time) ? ?Medications Ordered in UC ?Medications - No data to display ? ?Initial Impression / Assessment and Plan / UC Course  ?I have reviewed the triage vital signs and the nursing notes. ? ?Pertinent labs & imaging results that were available during my care of the patient were reviewed by me and considered in my medical decision making (see chart for details). ? ?  ? ?We will swab for COVID, and she is at high risk for severe disease with her elevated BMI.  Last renal function was normal, so if positive, she should receive possibly a Paxlovid prescription. ? ?With the possibility  of preeclamptic symptoms going on, I have asked  her to proceed to the maternal admissions unit after she leaves here, ?Final Clinical Impressions(s) / UC Diagnoses  ? ?Final diagnoses:  ?Viral upper respiratory tract infection  ?Edema, unspecified type  ? ? ? ?Discharge Instructions   ? ?  ? ?You have been swabbed for COVID, and the test will result in the next 24 hours. Our staff will call you if positive. If the test is positive, you should quarantine for 5 days.  ? ?You can take robitussin DM or mucinex DM as needed for congestion ? ?Please proceed to the maternal admissions unit at [facility] to further evaluate your swelling and h/a symptoms. ? ? ? ? ?ED Prescriptions   ?None ?  ? ?PDMP not reviewed this encounter. ?  ?Zenia Resides, MD ?04/02/21 1005 ? ?  ?Zenia Resides, MD ?04/02/21 1006 ? ?

## 2021-04-02 NOTE — Discharge Instructions (Addendum)
?  You have been swabbed for COVID, and the test will result in the next 24 hours. Our staff will call you if positive. If the test is positive, you should quarantine for 5 days.  ? ?You can take robitussin DM or mucinex DM as needed for congestion ? ?Please proceed to the maternal admissions unit at [facility] to further evaluate your swelling and h/a symptoms. ?

## 2021-04-02 NOTE — ED Triage Notes (Signed)
Took lasix for four days started feb 25th, swelling continues,.  ?

## 2021-04-02 NOTE — ED Triage Notes (Signed)
Also has cough and similar cold symptoms as son ?

## 2021-04-02 NOTE — ED Triage Notes (Signed)
PT had a baby 2 weeks ago. Notes that she had BP issues the day of delivery but not before. Reports bilateral foot swelling for 2 weeks - is taking diuretic. Had one bad headache that lasted one day, resolved after 4 goody powders over 24 hour period ? ?Wants BP check.  ?

## 2021-04-06 ENCOUNTER — Encounter (HOSPITAL_COMMUNITY): Payer: Self-pay | Admitting: *Deleted

## 2021-04-06 ENCOUNTER — Other Ambulatory Visit: Payer: Self-pay

## 2021-04-06 ENCOUNTER — Ambulatory Visit (HOSPITAL_COMMUNITY)
Admission: EM | Admit: 2021-04-06 | Discharge: 2021-04-06 | Disposition: A | Discharge status: Home / Self Care | Payer: Medicaid Other | Attending: Nurse Practitioner | Admitting: Nurse Practitioner

## 2021-04-06 DIAGNOSIS — R0602 Shortness of breath: Secondary | ICD-10-CM

## 2021-04-06 DIAGNOSIS — J4521 Mild intermittent asthma with (acute) exacerbation: Secondary | ICD-10-CM | POA: Diagnosis not present

## 2021-04-06 DIAGNOSIS — R051 Acute cough: Secondary | ICD-10-CM

## 2021-04-06 MED ORDER — ALBUTEROL SULFATE HFA 108 (90 BASE) MCG/ACT IN AERS: 2.0000 | INHALATION_SPRAY | Freq: Four times a day (QID) | RESPIRATORY_TRACT | 1 refills | Status: DC | PRN

## 2021-04-06 MED ORDER — PREDNISONE 20 MG PO TABS: 20.0000 mg | ORAL_TABLET | Freq: Every day | ORAL | 0 refills | Status: AC

## 2021-04-06 NOTE — ED Triage Notes (Signed)
SHOB, coughing 2-3 days. ?

## 2021-04-06 NOTE — ED Provider Notes (Signed)
[facility]    CSN: [id number] Arrival date & time: 04/06/21  1229      History   Chief Complaint Chief Complaint  Patient presents with   Asthma   Shortness of Breath    HPI Patty Snyder is a 36 y.o. female.   The patient is a 36 year old female who presents with asthma exacerbation for the past 2 days.  The patient states she has been having shortness of breath, difficulty breathing and wheezing.  She states that everyone in her household is sick.  She has a productive cough of clear to yellow sputum.  She also complains of a headache.  She denies fever, chills, ear pain, nausea, vomiting, or diarrhea.  She states her last asthma exacerbation was more than 1 year ago.  She states she has been using her albuterol inhaler frequently over the past 2 days, along with her nebulizer treatments..  She states that she usually gets sick around this time of the year, and when there is a sudden change in the weather.  She is approximately 2 to 3 weeks postpartum and is currently breast-feeding.   Asthma The problem has not changed since onset.Associated symptoms include headaches and shortness of breath.  Shortness of Breath Associated symptoms: cough, headaches and wheezing   Associated symptoms: no ear pain and no sore throat    Past Medical History:  Diagnosis Date   Asthma    last used inhaler last night   Gestational hypertension 03/19/2021   History of gestational diabetes 03/30/2019   HSV (herpes simplex virus) infection    Hypertension     Patient Active Problem List   Diagnosis Date Noted   IUD (intrauterine device) in place 03/20/2021   Group B Streptococcus carrier, +RV culture, currently pregnant 03/19/2021   Breech presentation 03/19/2021   Vaginal delivery 03/19/2021   Unstable lie of fetus    Multigravida of advanced maternal age in third trimester 02/09/2021   Anxiety and depression 11/12/2020   ASCUS with positive high risk HPV cervical 09/16/2020    Supervision of high risk pregnancy, antepartum 08/11/2020   History of pregnancy induced hypertension 03/30/2019   Mild intermittent asthma without complication 07/15/2016   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 Authorizing Provider  albuterol (VENTOLIN HFA) 108 (90 Base) MCG/ACT inhaler Inhale 2 puffs into the lungs every 6 (six) hours as needed for up to 10 days for wheezing or shortness of breath. 04/06/21 04/16/21 Yes Marzell Isakson-Warren, Sadie Haber, NP  predniSONE (DELTASONE) 20 MG tablet Take 1 tablet (20 mg total) by mouth daily with breakfast for 5 days. 04/06/21 04/11/21 Yes Alani Lacivita-Warren, Sadie Haber, NP  ADVAIR DISKUS 250-50 MCG/ACT AEPB Inhale 1 puff into the lungs daily. 02/09/21   Buda Bing, MD  furosemide (LASIX) 20 MG tablet Take 1 tablet (20 mg  total) by mouth daily. 03/21/21   Arabella MerlesShaw, Kimberly D, CNM  ibuprofen (ADVIL) 600 MG tablet Take 1 tablet (600 mg total) by mouth every 6 (six) hours as needed. 03/21/21   Arabella MerlesShaw, Kimberly D, CNM  montelukast (SINGULAIR) 10 MG tablet Take 10 mg by mouth at bedtime. 03/16/21   [provider]  polyethylene glycol powder (GLYCOLAX/MIRALAX) 17 GM/SCOOP powder Take 17 g by mouth daily as needed. Patient taking differently: Take 17 g by mouth daily as needed for mild constipation. 11/12/20   Venora MaplesEckstat, Matthew M, MD  Prenatal Vit-Fe Fumarate-FA (PREPLUS) 27-1 MG TABS Take 1 tablet by mouth daily. 08/04/20   Marny LowensteinWenzel, Julie N, PA-C    Family History Family History  Problem Relation Age of Onset   Asthma Mother    Hyperlipidemia Father    Asthma  Father     Social History Social History   Tobacco Use   Smoking status: Never   Smokeless tobacco: Never  Vaping Use   Vaping Use: Never used  Substance Use Topics   Alcohol use: Not Currently   Drug use: Never     Allergies   Iodine and Shellfish allergy   Review of Systems Review of Systems  Constitutional: Negative.   HENT:  Positive for congestion, postnasal drip, rhinorrhea and sneezing. Negative for ear pain, sinus pressure and sore throat.   Eyes: Negative.   Respiratory:  Positive for cough, shortness of breath and wheezing.   Cardiovascular: Negative.   Gastrointestinal: Negative.   Skin: Negative.   Neurological:  Positive for headaches.  Psychiatric/Behavioral: Negative.      Physical Exam Triage Vital Signs ED Triage Vitals [04/06/21 1459]  Enc Vitals Group     BP      Pulse      Resp      Temp      Temp src      SpO2      Weight      Height      Head Circumference      Peak Flow      Pain Score 0     Pain Loc      Pain Edu?      Excl. in GC?    No data found.  Updated Vital Signs BP (!) 144/106    Pulse 100    Temp 98.1 F (36.7 C)    Resp 20    LMP 06/25/2020 (Exact Date) Comment: Pt has aa 712week old baby   SpO2 94%    Breastfeeding Yes   Visual Acuity Right Eye Distance:   Left Eye Distance:   Bilateral Distance:    Right Eye Near:   Left Eye Near:    Bilateral Near:     Physical Exam Constitutional:      General: She is not in acute distress.    Appearance: She is well-developed. She is obese.  HENT:     Head: Normocephalic and atraumatic.     Mouth/Throat:     Mouth: Mucous membranes are moist.  Eyes:     Extraocular Movements: Extraocular movements intact.     Pupils: Pupils are equal, round, and reactive to light.  Cardiovascular:     Rate and Rhythm: Normal rate and regular rhythm.  Pulmonary:     Effort: Pulmonary effort is normal.     Breath sounds: Examination of the right-upper field reveals wheezing and  rhonchi. Examination of the left-upper field reveals wheezing and rhonchi. Examination of the right-middle field reveals wheezing  and rhonchi. Examination of the left-middle field reveals wheezing and rhonchi. Examination of the right-lower field reveals wheezing and rhonchi. Examination of the left-lower field reveals wheezing and rhonchi. Wheezing and rhonchi present.  Abdominal:     General: Bowel sounds are normal.     Palpations: Abdomen is soft.     Tenderness: There is no abdominal tenderness.  Musculoskeletal:     Cervical back: Normal range of motion and neck supple.  Lymphadenopathy:     Cervical: No cervical adenopathy.  Skin:    General: Skin is warm and dry.  Neurological:     General: No focal deficit present.     Mental Status: She is alert and oriented to person, place, and time.  Psychiatric:        Mood and Affect: Mood normal.        Behavior: Behavior normal.     UC Treatments / Results  Labs (all labs ordered are listed, but only abnormal results are displayed) Labs Reviewed - No data to display  EKG   Radiology No results found.  Procedures Procedures (including critical care time)  Medications Ordered in UC Medications - No data to display  Initial Impression / Assessment and Plan / UC Course  I have reviewed the triage vital signs and the nursing notes.  Pertinent labs & imaging results that were available during my care of the patient were reviewed by me and considered in my medical decision making (see chart for details).  Patient presents with asthma exacerbation over the past 2 days.  She presents with wheezing throughout, rhonchi, with intermittent shortness of breath.  The patient states she has been using her inhaler frequently with minimal relief.  Symptoms are consistent with an asthma exacerbation. She has been using her nebulizer treatments at home.  Patient is currently breast-feeding.  I will put the patient on prednisone 20 mg for 5  days, consultation with Dr. Leonides Grills regarding this treatment with breastfeeding.  Patient's blood pressure remains elevated today.  She was last seen on 04/02/2021 and was encouraged up with maternal admissions for preeclamptic symptoms.  Patient was encouraged to increase fluids, get plenty of rest, and try to avoid possible triggers.  She was also encouraged to use her humidifier when she is at home and during sleep.  Patient was encouraged to follow-up in 3 to 5 days if her symptoms worsen or do not improve. Final Clinical Impressions(s) / UC Diagnoses   Final diagnoses:  Mild intermittent asthma with acute exacerbation  Shortness of breath  Acute cough     Discharge Instructions      Take medication as prescribed. Use a humidifier when at home and during sleep. Try to avoid triggers. Follow-up if symptoms do not not improve within the next 3 to 5 days or sooner.      ED Prescriptions     Medication Sig Dispense Auth. Provider   predniSONE (DELTASONE) 20 MG tablet Take 1 tablet (20 mg total) by mouth daily with breakfast for 5 days. 5 tablet Hermela Hardt-Warren, Sadie Haber, NP   albuterol (VENTOLIN HFA) 108 (90 Base) MCG/ACT inhaler Inhale 2 puffs into the lungs every 6 (six) hours as needed for up to 10 days for wheezing or shortness of breath. 8 g Dai Mcadams-Warren, Sadie Haber, NP      PDMP not reviewed this encounter.   Abran Cantor, NP 04/06/21 [phone]

## 2021-04-06 NOTE — Discharge Instructions (Addendum)
Take medication as prescribed. ?Use a humidifier when at home and during sleep. ?Try to avoid triggers. ?Follow-up if symptoms do not not improve within the next 3 to 5 days or sooner. ? ?

## 2021-04-17 ENCOUNTER — Ambulatory Visit (INDEPENDENT_AMBULATORY_CARE_PROVIDER_SITE_OTHER): Payer: Medicaid Other | Admitting: Obstetrics and Gynecology

## 2021-04-17 ENCOUNTER — Encounter: Payer: Self-pay | Admitting: Obstetrics and Gynecology

## 2021-04-17 ENCOUNTER — Other Ambulatory Visit (HOSPITAL_COMMUNITY)
Admission: RE | Admit: 2021-04-17 | Discharge: 2021-04-17 | Disposition: A | Discharge status: Home / Self Care | Payer: Medicaid Other | Source: Ambulatory Visit | Attending: Obstetrics and Gynecology | Admitting: Obstetrics and Gynecology

## 2021-04-17 ENCOUNTER — Other Ambulatory Visit: Payer: Self-pay

## 2021-04-17 DIAGNOSIS — Z30431 Encounter for routine checking of intrauterine contraceptive device: Secondary | ICD-10-CM

## 2021-04-17 NOTE — Progress Notes (Signed)
? ? ?Post Partum Visit Note ? ?Patty Snyder is a 36 y.o. [id number] female who presents for a postpartum visit. She is 4 weeks postpartum following a normal spontaneous vaginal delivery.  I have fully reviewed the prenatal and intrapartum course. The delivery was at [redacted]w[redacted]d gestational weeks.  Anesthesia: epidural. Postpartum course has been going well per patient. Baby is doing well patient. Baby is feeding by breast. Bleeding staining only. Bowel function is normal. Bladder function is abnormal: patient complains of urgency . Patient is not sexually active. Contraception method is IUD. Postpartum depression screening: negative. ? ? ?The pregnancy intention screening data noted above was reviewed. Potential methods of contraception were discussed. The patient elected to proceed with No data recorded. ? ? Edinburgh Postnatal Depression Scale - 04/17/21 1058   ? ?  ? Edinburgh Postnatal Depression Scale:  In the Past 7 Days  ? I have been able to laugh and see the funny side of things. 0   ? I have looked forward with enjoyment to things. 0   ? I have blamed myself unnecessarily when things went wrong. 0   ? I have been anxious or worried for no good reason. 0   ? I have felt scared or panicky for no good reason. 0   ? Things have been getting on top of me. 0   ? I have been so unhappy that I have had difficulty sleeping. 0   ? I have felt sad or miserable. 0   ? I have been so unhappy that I have been crying. 0   ? The thought of harming myself has occurred to me. 0   ? Edinburgh Postnatal Depression Scale Total 0   ? ?  ?  ? ?  ? ? ?Health Maintenance Due  ?Topic Date Due  ? COVID-19 Vaccine (3 - Booster for Moderna series) 06/08/2019  ? ? ?  ? ?Review of Systems ?Pertinent items noted in HPI and remainder of comprehensive ROS otherwise negative. ? ?Objective:  ?BP 126/77   Pulse 98   Wt (!) 350 lb 4.8 oz (158.9 kg)   LMP 06/25/2020 (Exact Date)   Breastfeeding Yes   BMI 47.51 kg/m?   ? ?General:  alert,  cooperative, and no distress  ? Breasts:  normal  ?Lungs: clear to auscultation bilaterally  ?Heart:  regular rate and rhythm  ?Abdomen: soft, non-tender; bowel sounds normal; no masses,  no organomegaly   ?Wound N/a  ?GU exam:  normal, IUD strings not visualized  ?     ?Assessment:  ? ? There are no diagnoses linked to this encounter. ? ?Normal postpartum exam.  ? ?Plan:  ? ?Essential components of care per ACOG recommendations: ? ?1.  Mood and well being: Patient with negative depression screening today. Reviewed local resources for support.  ?- Patient tobacco use? No.   ?- hx of drug use? No.   ? ?2. Infant care and feeding:  ?-Patient currently breastmilk feeding? Yes. Reviewed importance of draining breast regularly to support lactation.  ?-Social determinants of health (SDOH) reviewed in EPIC. No concerns ? ?3. Sexuality, contraception and birth spacing ?- Patient does not want a pregnancy in the next year.  Desired family size is 6 children.  ?- Reviewed reproductive life planning. Reviewed contraceptive methods based on pt preferences and effectiveness.  Patient desired IUD or IUS today.   ?- Discussed birth spacing of 18 months ? ?4. Sleep and fatigue ?-Encouraged family/partner/community support of 4 hrs of uninterrupted  sleep to help with mood and fatigue ? ?5. Physical Recovery  ?- Discussed patients delivery and complications. She describes her labor as good. ?- Patient had a Vaginal, no problems at delivery. Patient had no laceration. Perineal healing reviewed. Patient expressed understanding ?- Patient has urinary incontinence? No. ?- Patient is safe to resume physical and sexual activity ? ?6.  Health Maintenance ?- HM due items addressed Yes ?- Last pap smear No results found for: DIAGPAP Pap smear done at today's visit.  ?-Breast Cancer screening indicated? No.  ? ?7. Chronic Disease/Pregnancy Condition follow up: None ?Pelvic ultrasound ordered for IUD location ? ?- PCP follow up ? ?Mora Bellman, MD ?[facility], [place]  ?

## 2021-04-17 NOTE — Progress Notes (Signed)
Patient ultrasound scheduled for 04/27/21 at 10:45 AM. Patient notified.  ?

## 2021-04-19 LAB — URINE CULTURE

## 2021-04-22 LAB — CYTOLOGY - PAP
Comment: NEGATIVE
Diagnosis: NEGATIVE
High risk HPV: NEGATIVE

## 2021-04-25 DIAGNOSIS — Z419 Encounter for procedure for purposes other than remedying health state, unspecified: Secondary | ICD-10-CM | POA: Diagnosis not present

## 2021-04-27 ENCOUNTER — Ambulatory Visit
Admission: RE | Admit: 2021-04-27 | Discharge: 2021-04-27 | Disposition: A | Discharge status: Home / Self Care | Payer: Medicaid Other | Source: Ambulatory Visit | Attending: Obstetrics and Gynecology | Admitting: Obstetrics and Gynecology

## 2021-04-27 DIAGNOSIS — Z30431 Encounter for routine checking of intrauterine contraceptive device: Secondary | ICD-10-CM | POA: Diagnosis not present

## 2021-04-27 DIAGNOSIS — N939 Abnormal uterine and vaginal bleeding, unspecified: Secondary | ICD-10-CM | POA: Diagnosis not present

## 2021-05-22 ENCOUNTER — Emergency Department (HOSPITAL_COMMUNITY)
Admission: EM | Admit: 2021-05-22 | Discharge: 2021-05-22 | Disposition: A | Discharge status: Home / Self Care | Payer: Medicaid Other | Attending: Emergency Medicine | Admitting: Emergency Medicine

## 2021-05-22 DIAGNOSIS — M25571 Pain in right ankle and joints of right foot: Secondary | ICD-10-CM | POA: Insufficient documentation

## 2021-05-22 DIAGNOSIS — M545 Low back pain, unspecified: Secondary | ICD-10-CM | POA: Diagnosis not present

## 2021-05-22 DIAGNOSIS — S80211A Abrasion, right knee, initial encounter: Secondary | ICD-10-CM | POA: Diagnosis not present

## 2021-05-22 DIAGNOSIS — T1490XA Injury, unspecified, initial encounter: Secondary | ICD-10-CM | POA: Diagnosis not present

## 2021-05-22 DIAGNOSIS — Y92410 Unspecified street and highway as the place of occurrence of the external cause: Secondary | ICD-10-CM | POA: Diagnosis not present

## 2021-05-22 DIAGNOSIS — Z5321 Procedure and treatment not carried out due to patient leaving prior to being seen by health care provider: Secondary | ICD-10-CM | POA: Insufficient documentation

## 2021-05-22 DIAGNOSIS — S8991XA Unspecified injury of right lower leg, initial encounter: Secondary | ICD-10-CM | POA: Diagnosis present

## 2021-05-22 DIAGNOSIS — S93401A Sprain of unspecified ligament of right ankle, initial encounter: Secondary | ICD-10-CM | POA: Diagnosis not present

## 2021-05-22 DIAGNOSIS — Z743 Need for continuous supervision: Secondary | ICD-10-CM | POA: Diagnosis not present
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
## 2021-05-23 ENCOUNTER — Emergency Department (HOSPITAL_COMMUNITY)
Admission: EM | Admit: 2021-05-23 | Discharge: 2021-05-23 | Disposition: A | Discharge status: Home / Self Care | Payer: Medicaid Other | Attending: Emergency Medicine | Admitting: Emergency Medicine

## 2021-05-23 ENCOUNTER — Emergency Department (HOSPITAL_COMMUNITY): Payer: Medicaid Other

## 2021-05-23 ENCOUNTER — Other Ambulatory Visit: Payer: Self-pay

## 2021-05-23 DIAGNOSIS — Y92410 Unspecified street and highway as the place of occurrence of the external cause: Secondary | ICD-10-CM | POA: Insufficient documentation

## 2021-05-23 DIAGNOSIS — E119 Type 2 diabetes mellitus without complications: Secondary | ICD-10-CM | POA: Insufficient documentation

## 2021-05-23 DIAGNOSIS — R609 Edema, unspecified: Secondary | ICD-10-CM | POA: Diagnosis not present

## 2021-05-23 DIAGNOSIS — S93401A Sprain of unspecified ligament of right ankle, initial encounter: Secondary | ICD-10-CM | POA: Diagnosis not present

## 2021-05-23 DIAGNOSIS — M25571 Pain in right ankle and joints of right foot: Secondary | ICD-10-CM | POA: Diagnosis not present

## 2021-05-23 DIAGNOSIS — J45909 Unspecified asthma, uncomplicated: Secondary | ICD-10-CM | POA: Diagnosis not present

## 2021-05-23 DIAGNOSIS — S99911A Unspecified injury of right ankle, initial encounter: Secondary | ICD-10-CM | POA: Diagnosis not present

## 2021-05-23 DIAGNOSIS — I10 Essential (primary) hypertension: Secondary | ICD-10-CM | POA: Diagnosis not present

## 2021-05-23 DIAGNOSIS — Z743 Need for continuous supervision: Secondary | ICD-10-CM | POA: Diagnosis not present

## 2021-05-23 DIAGNOSIS — M545 Low back pain, unspecified: Secondary | ICD-10-CM | POA: Diagnosis not present

## 2021-05-23 DIAGNOSIS — M7989 Other specified soft tissue disorders: Secondary | ICD-10-CM | POA: Diagnosis not present

## 2021-05-23 DIAGNOSIS — M79661 Pain in right lower leg: Secondary | ICD-10-CM | POA: Diagnosis not present

## 2021-05-23 MED ORDER — HYDROCODONE-ACETAMINOPHEN 5-325 MG PO TABS
1.0000 | ORAL_TABLET | Freq: Once | ORAL | Status: DC
  Filled 2021-05-23: qty 1

## 2021-05-23 MED ORDER — IBUPROFEN 400 MG PO TABS
600.0000 mg | ORAL_TABLET | Freq: Once | ORAL | Status: AC
  Administered 2021-05-23: 600 mg via ORAL
  Filled 2021-05-23: qty 1

## 2021-05-23 MED ORDER — MELOXICAM 7.5 MG PO TABS: 7.5000 mg | ORAL_TABLET | Freq: Two times a day (BID) | ORAL | 0 refills | Status: AC | PRN

## 2021-05-23 NOTE — Discharge Instructions (Signed)
Your x-rays do not show any signs of broken bones ? ?Take Mobic twice a day as needed for pain ? ?RICE therapy means rest ice compression elevation ? ?Crutches for help and support with walking ? ?See your doctor in 1 week if no improvement ?
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
## 2021-05-23 NOTE — Progress Notes (Signed)
Orthopedic Tech Progress Note ?Patient Details:  ?Patty Snyder ?1985/12/06 ?[id number] ? ?Ortho Devices ?Type of Ortho Device: Ace wrap, Crutches ?Ortho Device/Splint Interventions: Application ?  ?Post Interventions ?Patient Tolerated: Well ?Instructions Provided: Care of device, Adjustment of device ? ?Patty Snyder ?05/23/2021, 6:36 PM ? ?
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
## 2021-05-25 DIAGNOSIS — Z419 Encounter for procedure for purposes other than remedying health state, unspecified: Secondary | ICD-10-CM | POA: Diagnosis not present

## 2021-05-28 DIAGNOSIS — S8011XA Contusion of right lower leg, initial encounter: Secondary | ICD-10-CM | POA: Diagnosis not present

## 2021-05-28 DIAGNOSIS — T148XXA Other injury of unspecified body region, initial encounter: Secondary | ICD-10-CM | POA: Diagnosis not present

## 2021-06-25 DIAGNOSIS — Z419 Encounter for procedure for purposes other than remedying health state, unspecified: Secondary | ICD-10-CM | POA: Diagnosis not present

## 2021-07-02 ENCOUNTER — Encounter: Payer: Self-pay | Admitting: Family Medicine

## 2021-07-03 DIAGNOSIS — R609 Edema, unspecified: Secondary | ICD-10-CM | POA: Diagnosis not present

## 2021-07-03 DIAGNOSIS — Z8759 Personal history of other complications of pregnancy, childbirth and the puerperium: Secondary | ICD-10-CM | POA: Diagnosis not present

## 2021-07-03 DIAGNOSIS — I517 Cardiomegaly: Secondary | ICD-10-CM | POA: Insufficient documentation

## 2021-07-25 DIAGNOSIS — Z419 Encounter for procedure for purposes other than remedying health state, unspecified: Secondary | ICD-10-CM | POA: Diagnosis not present

## 2021-08-14 DIAGNOSIS — G8929 Other chronic pain: Secondary | ICD-10-CM | POA: Diagnosis not present

## 2021-08-14 DIAGNOSIS — Z6841 Body Mass Index (BMI) 40.0 and over, adult: Secondary | ICD-10-CM | POA: Diagnosis not present

## 2021-08-14 DIAGNOSIS — Z8632 Personal history of gestational diabetes: Secondary | ICD-10-CM | POA: Diagnosis not present

## 2021-08-14 DIAGNOSIS — R5383 Other fatigue: Secondary | ICD-10-CM | POA: Diagnosis not present

## 2021-08-14 DIAGNOSIS — K219 Gastro-esophageal reflux disease without esophagitis: Secondary | ICD-10-CM | POA: Diagnosis not present

## 2021-08-14 DIAGNOSIS — M545 Low back pain, unspecified: Secondary | ICD-10-CM | POA: Diagnosis not present

## 2021-08-14 DIAGNOSIS — Z79899 Other long term (current) drug therapy: Secondary | ICD-10-CM | POA: Diagnosis not present

## 2021-08-20 DIAGNOSIS — R0683 Snoring: Secondary | ICD-10-CM | POA: Insufficient documentation

## 2021-08-20 DIAGNOSIS — R69 Illness, unspecified: Secondary | ICD-10-CM | POA: Insufficient documentation

## 2021-08-20 DIAGNOSIS — R609 Edema, unspecified: Secondary | ICD-10-CM | POA: Insufficient documentation

## 2021-08-20 DIAGNOSIS — R9431 Abnormal electrocardiogram [ECG] [EKG]: Secondary | ICD-10-CM | POA: Insufficient documentation

## 2021-08-25 DIAGNOSIS — Z419 Encounter for procedure for purposes other than remedying health state, unspecified: Secondary | ICD-10-CM | POA: Diagnosis not present

## 2021-09-09 DIAGNOSIS — Z6841 Body Mass Index (BMI) 40.0 and over, adult: Secondary | ICD-10-CM | POA: Diagnosis not present

## 2021-09-09 DIAGNOSIS — G4710 Hypersomnia, unspecified: Secondary | ICD-10-CM | POA: Diagnosis not present

## 2021-09-09 DIAGNOSIS — R0683 Snoring: Secondary | ICD-10-CM | POA: Diagnosis not present

## 2021-09-25 DIAGNOSIS — Z419 Encounter for procedure for purposes other than remedying health state, unspecified: Secondary | ICD-10-CM | POA: Diagnosis not present

## 2021-10-23 ENCOUNTER — Encounter: Payer: Self-pay | Admitting: Obstetrics and Gynecology

## 2021-10-25 DIAGNOSIS — Z419 Encounter for procedure for purposes other than remedying health state, unspecified: Secondary | ICD-10-CM | POA: Diagnosis not present

## 2021-11-25 DIAGNOSIS — Z419 Encounter for procedure for purposes other than remedying health state, unspecified: Secondary | ICD-10-CM | POA: Diagnosis not present

## 2021-12-25 DIAGNOSIS — Z419 Encounter for procedure for purposes other than remedying health state, unspecified: Secondary | ICD-10-CM | POA: Diagnosis not present

## 2022-01-24 IMAGING — CR DG CHEST 2V
2 series · 2 of 2 positions shown · non-contrast
Comparison: None.

CLINICAL DATA: Chest pain

EXAM:
CHEST - 2 VIEW

[chest pa]
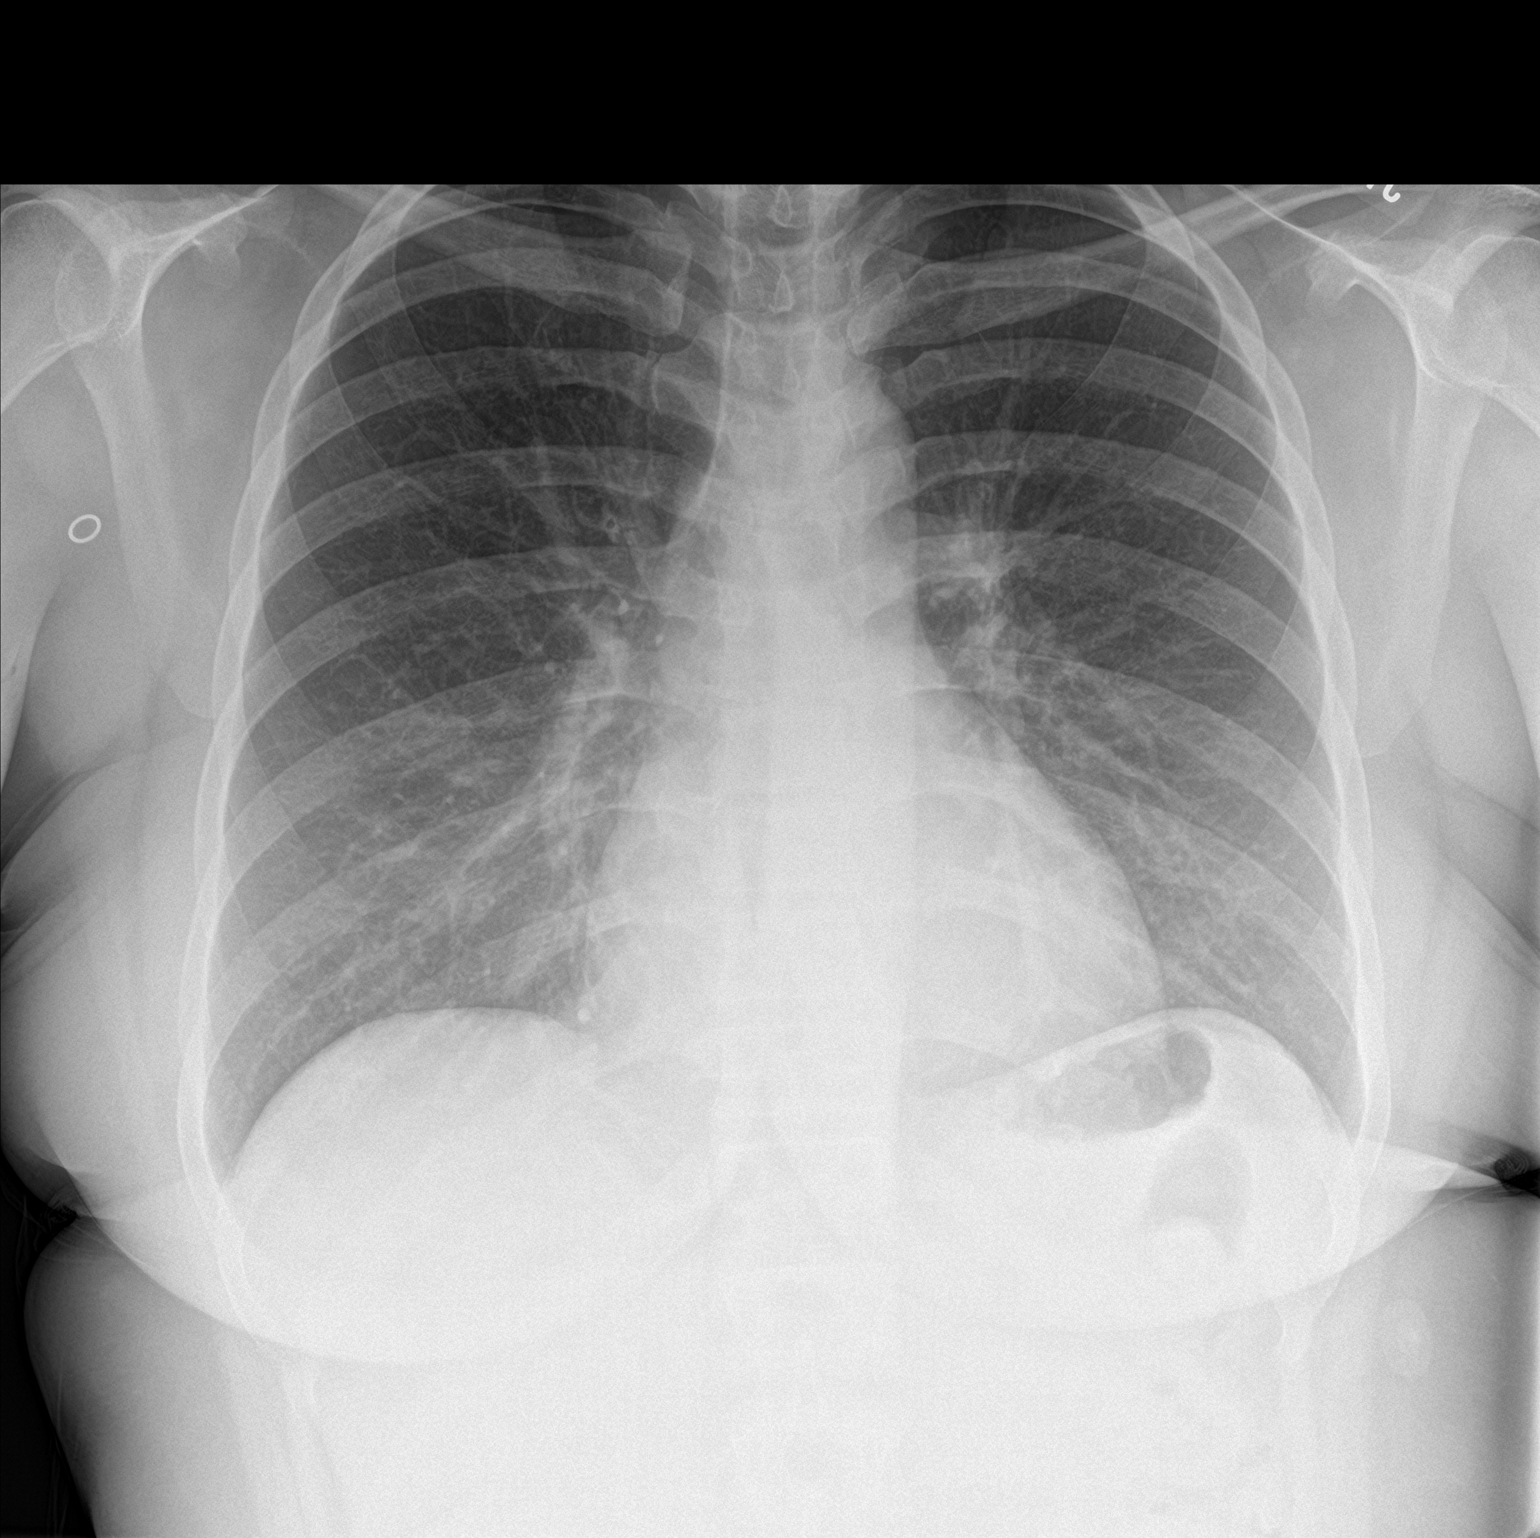

[chest lat]
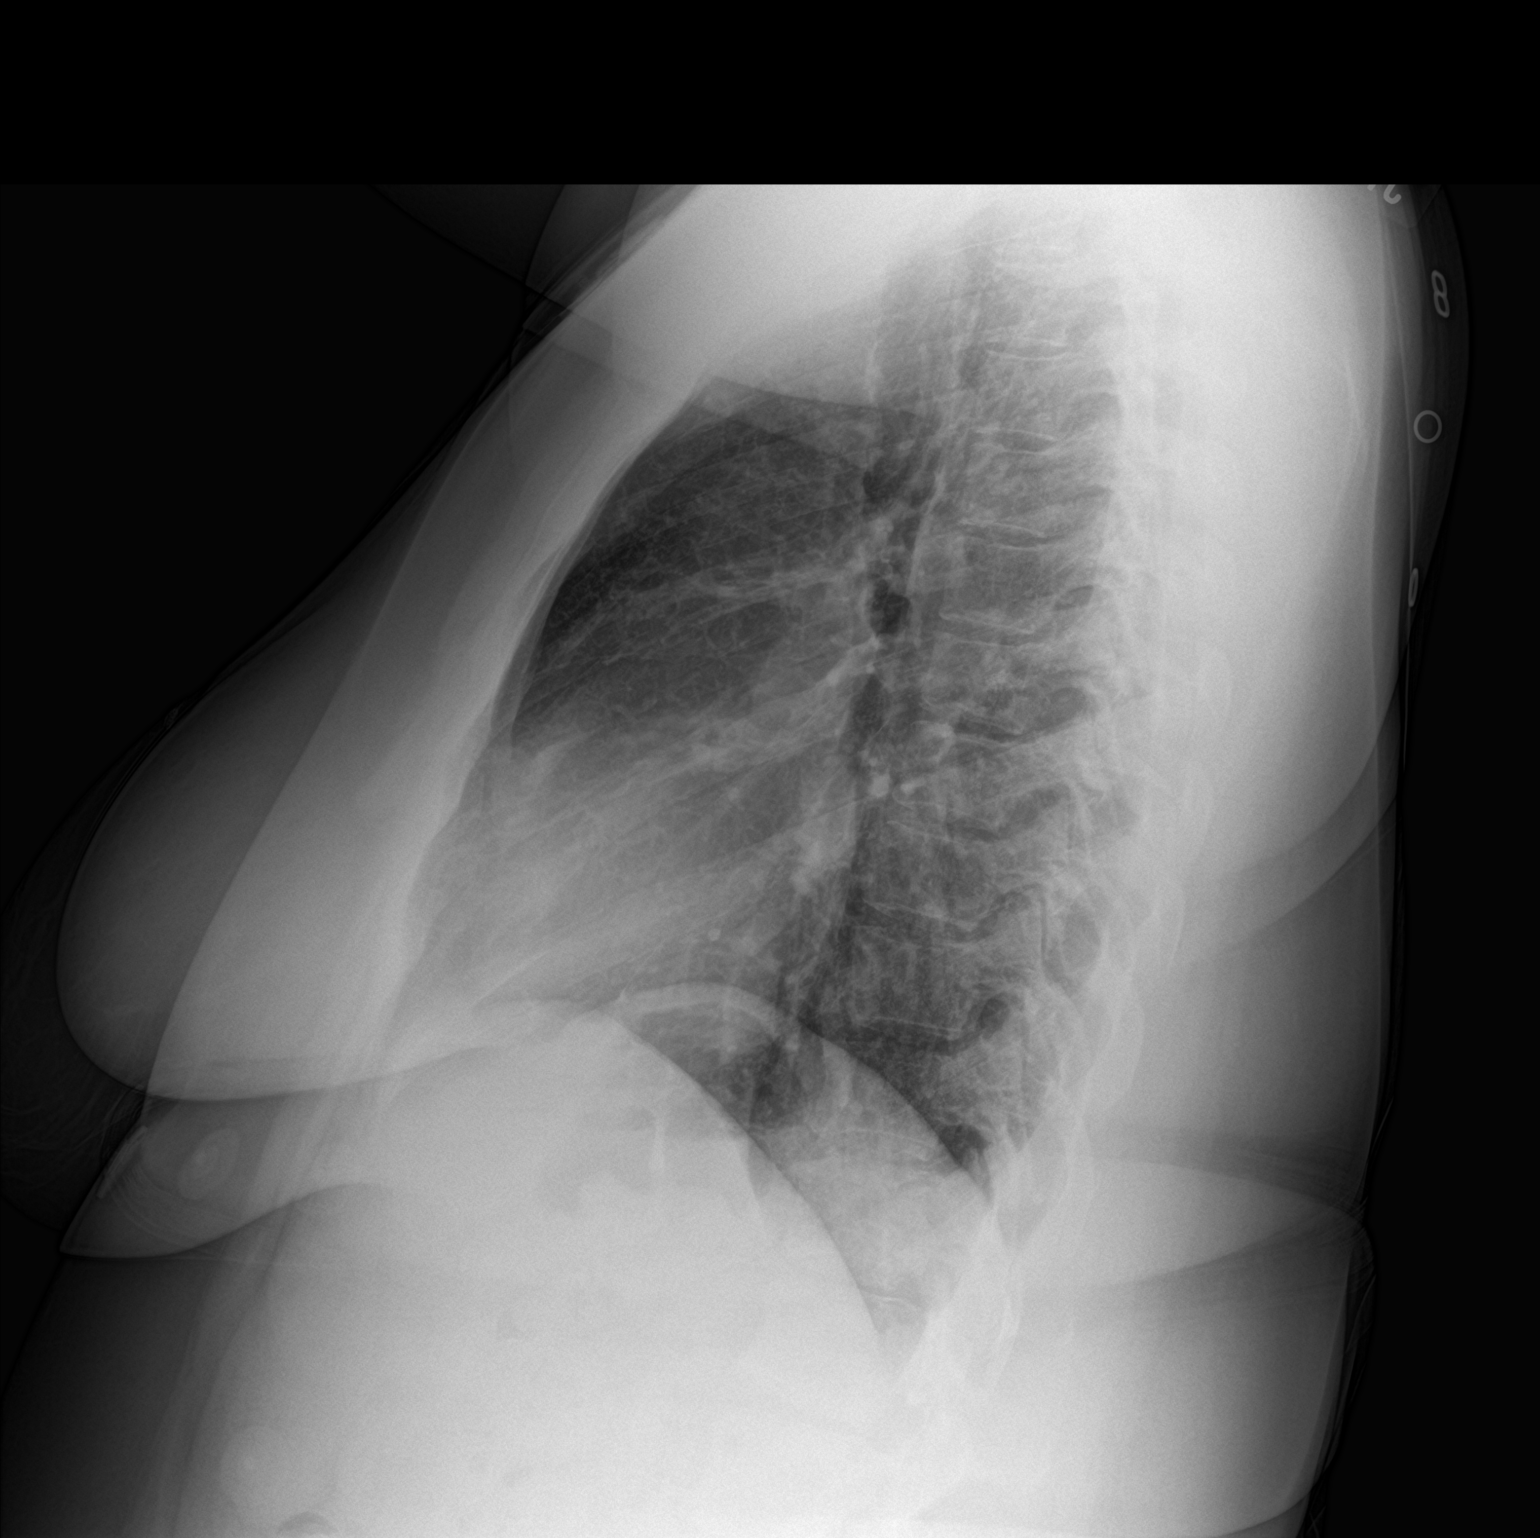

[2 of 2 positions shown; findings below may reference images not displayed]

FINDINGS: The heart size and mediastinal contours are within normal limits.
Both lungs are clear. The visualized skeletal structures are
unremarkable.
IMPRESSION: No active cardiopulmonary disease.

## 2022-01-25 DIAGNOSIS — Z419 Encounter for procedure for purposes other than remedying health state, unspecified: Secondary | ICD-10-CM | POA: Diagnosis not present

## 2022-01-30 IMAGING — US US OB COMP LESS 14 WK
1 series · 15 of 28 positions shown · non-contrast
Comparison: None.

CLINICAL DATA: 34-year-old female with bleeding in the 1st
trimester of pregnancy. Estimated gestational age by LMP 8 weeks 3
days.

EXAM:
OBSTETRIC <14 WK ULTRASOUND
TECHNIQUE: Transabdominal ultrasound was performed for evaluation of the
gestation as well as the maternal uterus and adnexal regions.

[Series 1: us ob comp less 14 wk · 41 acquisitions, 15 frames shown]
[im 1/41]
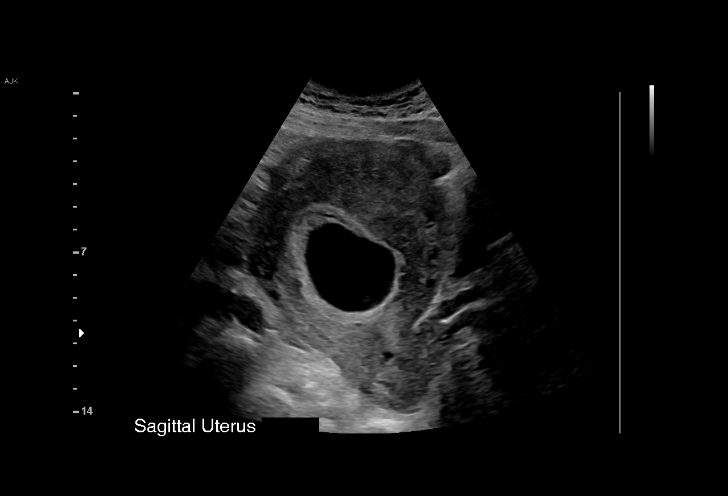
[im 3/41]
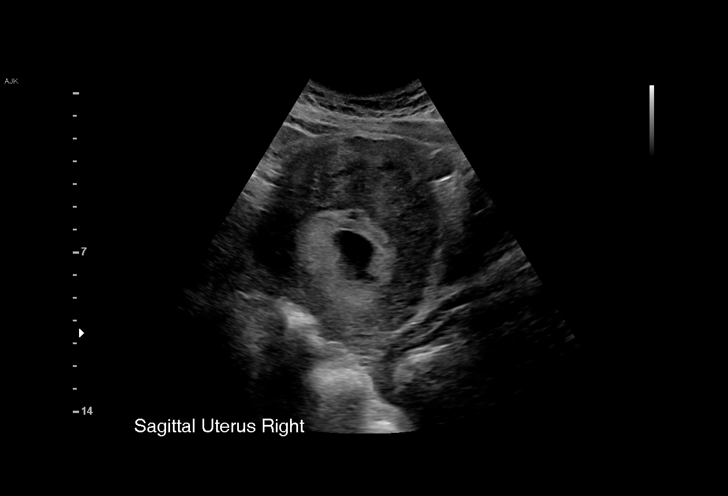
[im 6/41]
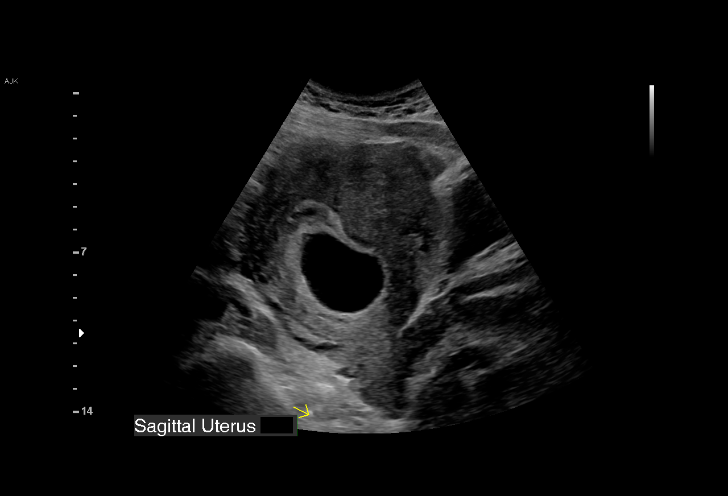
[im 9/41]
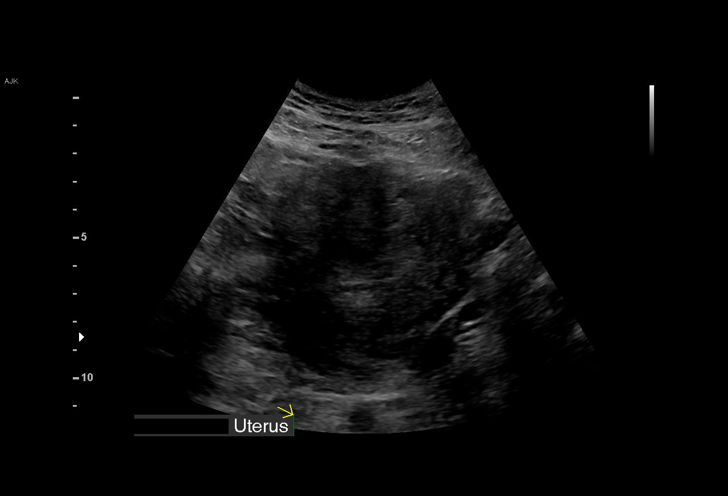
[im 12/41]
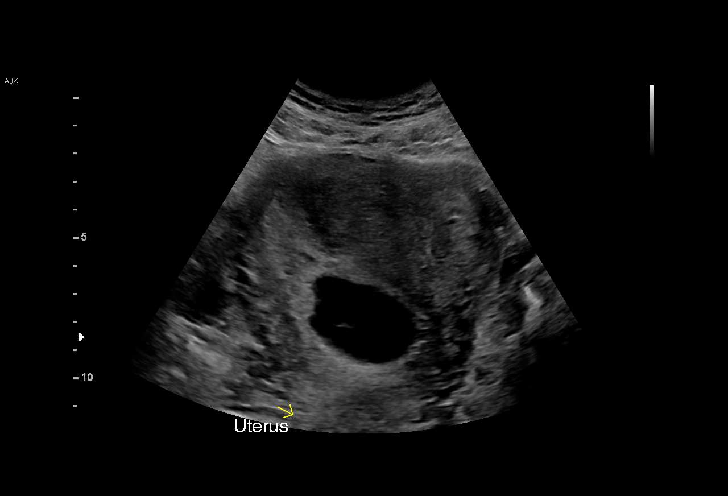
[im 15/41]
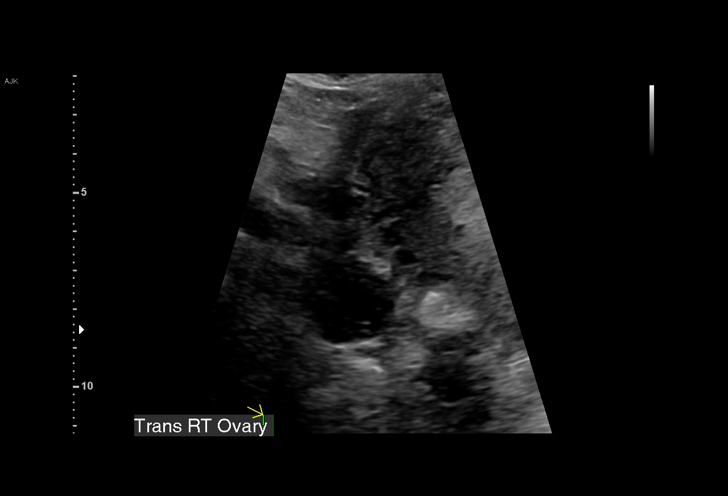
[im 18/41]
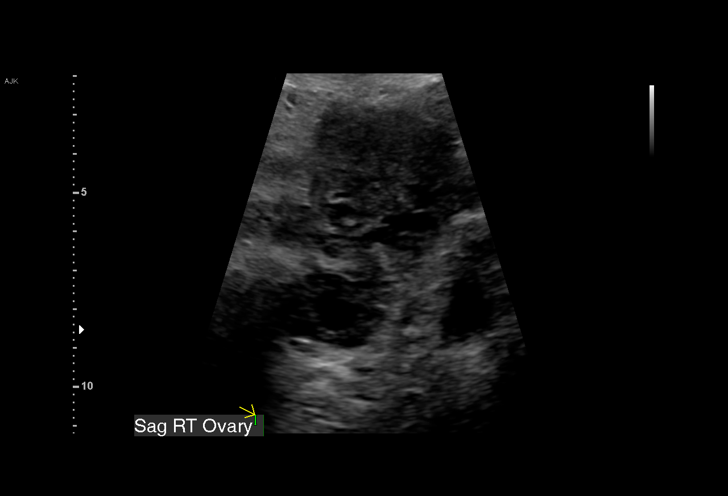
[im 21/41]
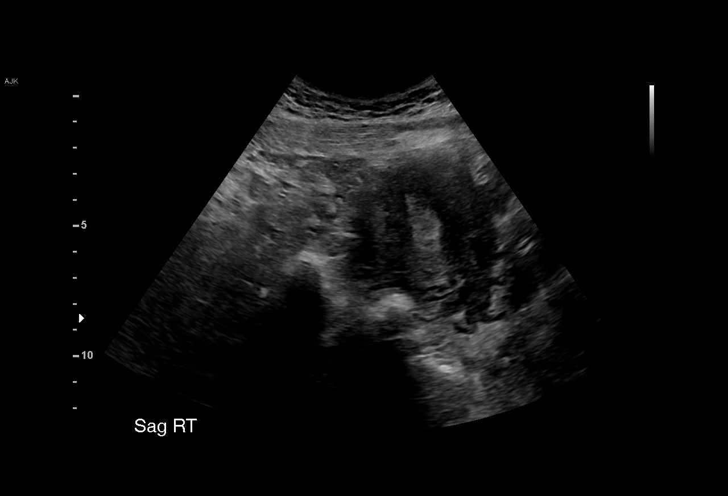
[im 23/41]
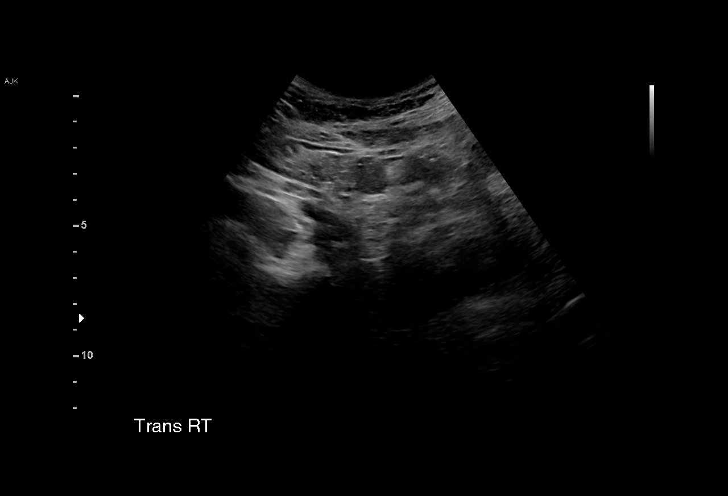
[im 26/41]
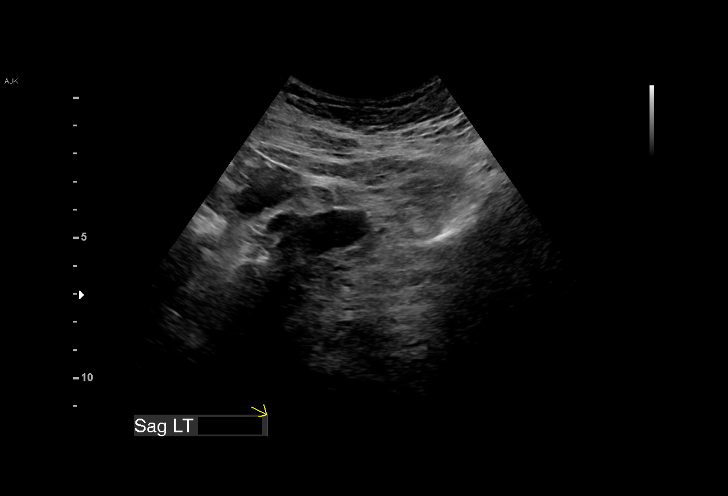
[im 29/41]
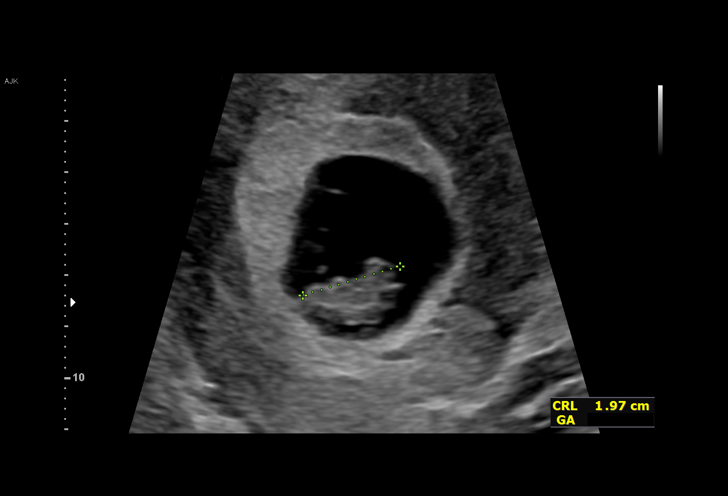
[im 32/41]
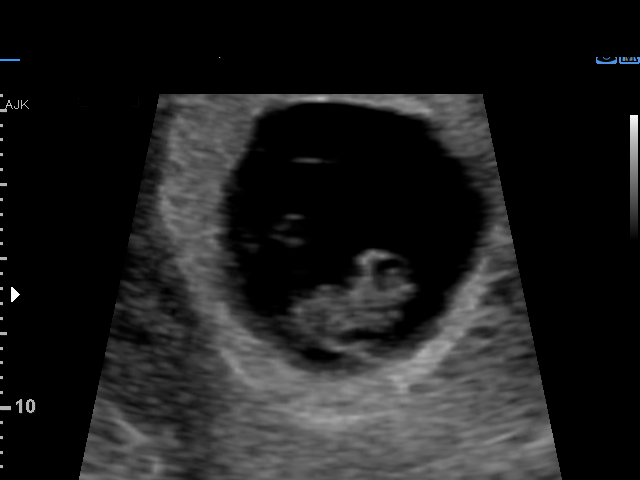
[im 35/41]
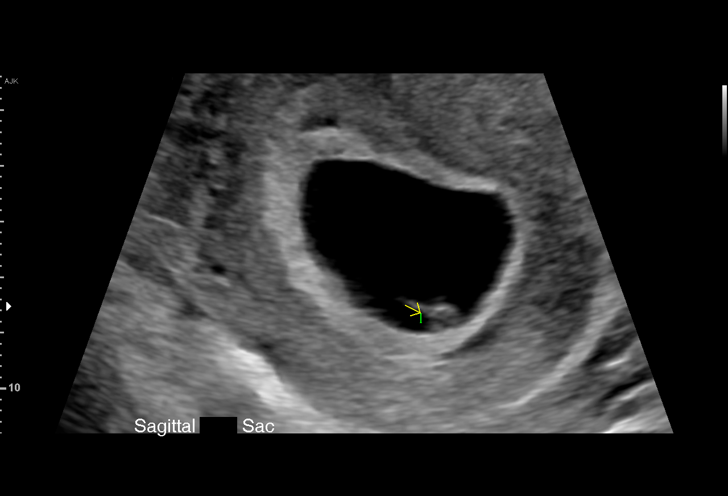
[im 38/41]
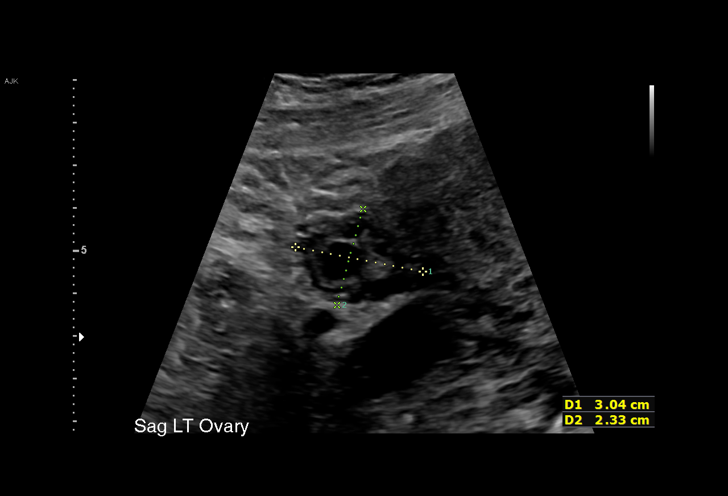
[im 41/41]
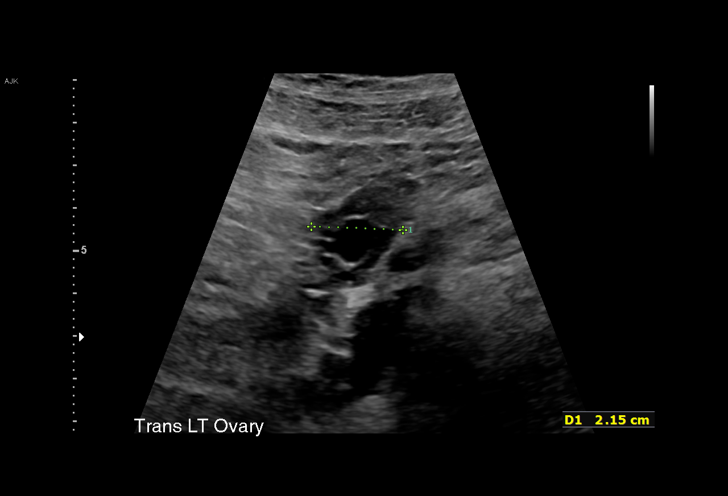

[15 of 28 positions shown; findings below may reference images not displayed]

FINDINGS: Intrauterine gestational sac: Single

Yolk sac:  Visible

Embryo:  Visible

Cardiac Activity: Detected

Heart Rate: 164 bpm

CRL:   19.5 mm   8 w 3 d                  US EDC: 03/17/2020

Subchorionic hemorrhage:  None visualized.

Maternal uterus/adnexae: No pelvic free fluid. The right ovary
appears normal measuring 2.7 x 2.5 x 1.8 cm. The left ovary appears
normal measuring 3.0 x 2.3 x 2.2 cm (with possible corpus luteum).
IMPRESSION: Single living IUP demonstrated. No acute maternal findings
visualized.

## 2022-02-25 DIAGNOSIS — Z419 Encounter for procedure for purposes other than remedying health state, unspecified: Secondary | ICD-10-CM | POA: Diagnosis not present

## 2022-03-26 DIAGNOSIS — Z419 Encounter for procedure for purposes other than remedying health state, unspecified: Secondary | ICD-10-CM | POA: Diagnosis not present

## 2022-04-26 DIAGNOSIS — Z419 Encounter for procedure for purposes other than remedying health state, unspecified: Secondary | ICD-10-CM | POA: Diagnosis not present

## 2022-05-26 DIAGNOSIS — Z419 Encounter for procedure for purposes other than remedying health state, unspecified: Secondary | ICD-10-CM | POA: Diagnosis not present

## 2022-06-26 DIAGNOSIS — Z419 Encounter for procedure for purposes other than remedying health state, unspecified: Secondary | ICD-10-CM | POA: Diagnosis not present

## 2022-07-26 DIAGNOSIS — Z419 Encounter for procedure for purposes other than remedying health state, unspecified: Secondary | ICD-10-CM | POA: Diagnosis not present

## 2022-08-08 ENCOUNTER — Encounter (HOSPITAL_COMMUNITY): Payer: Self-pay | Admitting: *Deleted

## 2022-08-08 ENCOUNTER — Ambulatory Visit (HOSPITAL_COMMUNITY)
Admission: EM | Admit: 2022-08-08 | Discharge: 2022-08-08 | Disposition: A | Discharge status: Home / Self Care | Payer: Medicaid Other | Attending: Emergency Medicine | Admitting: Emergency Medicine

## 2022-08-08 DIAGNOSIS — Z202 Contact with and (suspected) exposure to infections with a predominantly sexual mode of transmission: Secondary | ICD-10-CM

## 2022-08-08 DIAGNOSIS — N898 Other specified noninflammatory disorders of vagina: Secondary | ICD-10-CM | POA: Diagnosis not present

## 2022-08-08 NOTE — ED Triage Notes (Signed)
Pt states for the past several weeks she has had vaginal discharge and a foul odor. She states she has been using OTC yeast infection meds and it does help but sx return.

## 2022-08-08 NOTE — ED Provider Notes (Addendum)
[facility]    CSN: [id number] Arrival date & time: 08/08/22  1006      History   Chief Complaint Chief Complaint  Patient presents with   SEXUALLY TRANSMITTED DISEASE    HPI Patty Snyder is a 37 y.o. female.   37 year old female, Patty Snyder, presents to urgent care for evaluation of vaginal discharge she has had for several months.  Patient states she has the same sexual partner, no new partner,  last sexual intercourse was 2 weeks prior.  Patient states she has a history of yeast infections and used Monistat with relief of symptoms.  Patient here to get checked for STI.  Patient states previous STI in 77s "this does not feel the same".  Patient denies any abdominal pain, dysuria, fever, nausea or vomiting.    The history is provided by the patient. No language interpreter was used.    Past Medical History:  Diagnosis Date   Asthma    last used inhaler last night   Gestational hypertension 03/19/2021   History of gestational diabetes 03/30/2019   HSV (herpes simplex virus) infection    Hypertension     Patient Active Problem List   Diagnosis Date Noted   Vaginal discharge 08/08/2022   Possible exposure to STI 08/08/2022   IUD (intrauterine device) in place 03/20/2021   Group B Streptococcus carrier, +RV culture, currently pregnant 03/19/2021   Breech presentation 03/19/2021   Vaginal delivery 03/19/2021   Unstable lie of fetus    Multigravida of advanced maternal age in third trimester 02/09/2021   Anxiety and depression 11/12/2020   ASCUS with positive high risk HPV cervical 09/16/2020   Supervision of high risk pregnancy, antepartum 08/11/2020   History of pregnancy induced hypertension 03/30/2019   Mild intermittent asthma without complication 07/15/2016   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 Authorizing Provider  Semaglutide-Weight Management (WEGOVY) 2.4 MG/0.75ML SOAJ Inject into the skin. 06/23/22  Yes [provider]  ADVAIR DISKUS 250-50 MCG/ACT AEPB Inhale 1 puff into the lungs daily. 02/09/21   [place] Bing, MD  albuterol (VENTOLIN HFA) 108 (90 Base) MCG/ACT inhaler Inhale 2 puffs into the lungs every 6 (six) hours as needed for up to 10 days for wheezing or shortness of breath. 04/06/21 04/17/21  Leath-Warren, Sadie Haber, NP  furosemide (LASIX) 20 MG tablet Take 1 tablet (20 mg total) by mouth daily. Patient not taking: Reported on 04/17/2021 03/21/21   Arabella Merles, CNM  ibuprofen (ADVIL) 600 MG tablet Take 1 tablet (600 mg total) by mouth every 6 (six) hours as needed. 03/21/21   Arabella Merles, CNM  montelukast (SINGULAIR) 10 MG tablet Take 10 mg by mouth at bedtime. 03/16/21   [provider]  polyethylene glycol powder (GLYCOLAX/MIRALAX) 17 GM/SCOOP powder Take 17 g by mouth daily  as needed. Patient taking differently: Take 17 g by mouth daily as needed for mild constipation. 11/12/20   Venora Maples, MD  Prenatal Vit-Fe Fumarate-FA (PREPLUS) 27-1 MG TABS Take 1 tablet by mouth daily. 08/04/20   Marny Lowenstein, PA-C    Family History Family History  Problem Relation Age of Onset   Asthma Mother    Hyperlipidemia Father    Asthma Father     Social History Social History   Tobacco Use   Smoking status: Never   Smokeless tobacco: Never  Vaping Use   Vaping status: Never Used  Substance Use Topics   Alcohol use: Not Currently   Drug use: Never     Allergies   Iodine and Shellfish allergy   Review of Systems Review of Systems  Constitutional:  Negative for fever.   Gastrointestinal:  Negative for abdominal pain, nausea and vomiting.  Genitourinary:  Positive for vaginal discharge. Negative for dysuria.  All other systems reviewed and are negative.    Physical Exam Triage Vital Signs ED Triage Vitals  Encounter Vitals Group     BP 08/08/22 1042 112/79     Systolic BP Percentile --      Diastolic BP Percentile --      Pulse Rate 08/08/22 1042 82     Resp 08/08/22 1042 18     Temp 08/08/22 1042 98.5 F (36.9 C)     Temp Source 08/08/22 1042 Oral     SpO2 08/08/22 1042 98 %     Weight --      Height --      Head Circumference --      Peak Flow --      Pain Score 08/08/22 1040 0     Pain Loc --      Pain Education --      Exclude from Growth Chart --    No data found.  Updated Vital Signs BP 112/79 (BP Location: Right Arm)   Pulse 82   Temp 98.5 F (36.9 C) (Oral)   Resp 18   LMP 07/20/2022 (Exact Date)   SpO2 98%   Visual Acuity Right Eye Distance:   Left Eye Distance:   Bilateral Distance:    Right Eye Near:   Left Eye Near:    Bilateral Near:     Physical Exam Vitals and nursing note reviewed.  Constitutional:      Appearance: Normal appearance. She is well-developed and well-groomed.  Neck:     Trachea: Trachea normal.  Cardiovascular:     Rate and Rhythm: Normal rate and regular rhythm.     Pulses: Normal pulses.     Heart sounds: Normal heart sounds.  Pulmonary:     Effort: Pulmonary effort is normal.     Breath sounds: Normal breath sounds and air entry.  Abdominal:     General: Bowel sounds are normal.     Palpations: Abdomen is soft.     Tenderness: There is no abdominal tenderness.  Musculoskeletal:     Cervical back: Normal range of motion.  Neurological:     General: No focal deficit present.     Mental Status: She is alert and oriented to person, place, and time.     GCS: GCS eye subscore is 4. GCS verbal subscore is 5. GCS motor subscore is 6.  Psychiatric:        Attention and Perception:  Attention normal.        Mood and Affect: Mood normal.  Speech: Speech normal.        Behavior: Behavior normal. Behavior is cooperative.      UC Treatments / Results  Labs (all labs ordered are listed, but only abnormal results are displayed) Labs Reviewed  CERVICOVAGINAL ANCILLARY ONLY    EKG   Radiology No results found.  Procedures Procedures (including critical care time)  Medications Ordered in UC Medications - No data to display  Initial Impression / Assessment and Plan / UC Course  I have reviewed the triage vital signs and the nursing notes.  Pertinent labs & imaging results that were available during my care of the patient were reviewed by me and considered in my medical decision making (see chart for details).    Discussed exam findings and plan of care with patient, will test for STI, patient self swab.  Patient will check MyChart for results, will be notified if additional treatment is required.  Patient verbalized understanding this provider  Ddx: BV, STI, vaginitis Final Clinical Impressions(s) / UC Diagnoses   Final diagnoses:  Vaginal discharge  Possible exposure to STI     Discharge Instructions      Please check MyChart for results, avoid sexual activity until results are known/treatment completed if needed.  Use condoms with all future sexual activity.  Please follow-up with your PCP for general medical issues.  Return as needed.     ED Prescriptions   None    PDMP not reviewed this encounter.   Clancy Gourd, NP 08/08/22 1816    Clancy Gourd, NP 08/08/22 7829

## 2022-08-08 NOTE — Discharge Instructions (Addendum)
Please check MyChart for results, avoid sexual activity until results are known/treatment completed if needed.  Use condoms with all future sexual activity.  Please follow-up with your PCP for general medical issues.  Return as needed.

## 2022-08-09 ENCOUNTER — Telehealth: Payer: Self-pay

## 2022-08-09 LAB — CERVICOVAGINAL ANCILLARY ONLY
Bacterial Vaginitis (gardnerella): POSITIVE — AB
Candida Glabrata: NEGATIVE
Candida Vaginitis: NEGATIVE
Chlamydia: NEGATIVE
Comment: NEGATIVE
Comment: NEGATIVE
Comment: NEGATIVE
Comment: NEGATIVE
Comment: NEGATIVE
Comment: NORMAL
Neisseria Gonorrhea: NEGATIVE
Trichomonas: NEGATIVE

## 2022-08-09 MED ORDER — METRONIDAZOLE 500 MG PO TABS: 500.0000 mg | ORAL_TABLET | Freq: Two times a day (BID) | ORAL | 0 refills | Status: AC

## 2022-08-09 NOTE — Telephone Encounter (Signed)
Per protocol, pt requires tx with metronidazole. Reviewed with patient, verified pharmacy, prescription sent.

## 2022-08-26 DIAGNOSIS — Z419 Encounter for procedure for purposes other than remedying health state, unspecified: Secondary | ICD-10-CM | POA: Diagnosis not present

## 2022-09-15 ENCOUNTER — Ambulatory Visit: Payer: Medicaid Other | Admitting: Family Medicine

## 2022-09-26 DIAGNOSIS — Z419 Encounter for procedure for purposes other than remedying health state, unspecified: Secondary | ICD-10-CM | POA: Diagnosis not present

## 2022-10-08 ENCOUNTER — Ambulatory Visit: Payer: No Typology Code available for payment source | Admitting: Family Medicine

## 2022-10-11 DIAGNOSIS — G4733 Obstructive sleep apnea (adult) (pediatric): Secondary | ICD-10-CM | POA: Diagnosis not present

## 2022-10-26 DIAGNOSIS — Z419 Encounter for procedure for purposes other than remedying health state, unspecified: Secondary | ICD-10-CM | POA: Diagnosis not present

## 2022-10-29 DIAGNOSIS — M545 Low back pain, unspecified: Secondary | ICD-10-CM | POA: Diagnosis not present

## 2022-10-29 DIAGNOSIS — E66813 Obesity, class 3: Secondary | ICD-10-CM | POA: Diagnosis not present

## 2022-10-29 DIAGNOSIS — Z6841 Body Mass Index (BMI) 40.0 and over, adult: Secondary | ICD-10-CM | POA: Diagnosis not present

## 2022-10-29 DIAGNOSIS — G8929 Other chronic pain: Secondary | ICD-10-CM | POA: Diagnosis not present

## 2022-11-03 DIAGNOSIS — Z713 Dietary counseling and surveillance: Secondary | ICD-10-CM | POA: Diagnosis not present

## 2022-11-03 DIAGNOSIS — Z7182 Exercise counseling: Secondary | ICD-10-CM | POA: Diagnosis not present

## 2022-11-03 DIAGNOSIS — Z6841 Body Mass Index (BMI) 40.0 and over, adult: Secondary | ICD-10-CM | POA: Diagnosis not present

## 2022-11-03 DIAGNOSIS — F54 Psychological and behavioral factors associated with disorders or diseases classified elsewhere: Secondary | ICD-10-CM | POA: Diagnosis not present

## 2022-11-03 DIAGNOSIS — E66813 Obesity, class 3: Secondary | ICD-10-CM | POA: Diagnosis not present

## 2022-11-03 DIAGNOSIS — Z7189 Other specified counseling: Secondary | ICD-10-CM | POA: Diagnosis not present

## 2022-11-03 DIAGNOSIS — E785 Hyperlipidemia, unspecified: Secondary | ICD-10-CM | POA: Diagnosis not present

## 2022-11-04 DIAGNOSIS — Z6841 Body Mass Index (BMI) 40.0 and over, adult: Secondary | ICD-10-CM | POA: Diagnosis not present

## 2022-11-04 DIAGNOSIS — E66813 Obesity, class 3: Secondary | ICD-10-CM | POA: Diagnosis not present

## 2022-11-24 DIAGNOSIS — Z6841 Body Mass Index (BMI) 40.0 and over, adult: Secondary | ICD-10-CM | POA: Diagnosis not present

## 2022-11-24 DIAGNOSIS — E66813 Obesity, class 3: Secondary | ICD-10-CM | POA: Diagnosis not present

## 2022-11-26 DIAGNOSIS — Z419 Encounter for procedure for purposes other than remedying health state, unspecified: Secondary | ICD-10-CM | POA: Diagnosis not present

## 2022-12-01 DIAGNOSIS — G4730 Sleep apnea, unspecified: Secondary | ICD-10-CM | POA: Insufficient documentation

## 2022-12-01 DIAGNOSIS — G4733 Obstructive sleep apnea (adult) (pediatric): Secondary | ICD-10-CM | POA: Insufficient documentation

## 2022-12-03 DIAGNOSIS — G4733 Obstructive sleep apnea (adult) (pediatric): Secondary | ICD-10-CM | POA: Diagnosis not present

## 2022-12-03 DIAGNOSIS — Z6841 Body Mass Index (BMI) 40.0 and over, adult: Secondary | ICD-10-CM | POA: Diagnosis not present

## 2022-12-08 DIAGNOSIS — G4733 Obstructive sleep apnea (adult) (pediatric): Secondary | ICD-10-CM | POA: Diagnosis not present

## 2022-12-13 ENCOUNTER — Encounter: Payer: Self-pay | Admitting: Family Medicine

## 2022-12-13 ENCOUNTER — Ambulatory Visit (INDEPENDENT_AMBULATORY_CARE_PROVIDER_SITE_OTHER): Payer: Medicaid Other | Admitting: Family Medicine

## 2022-12-13 VITALS — BP 124/82 | HR 81 | Temp 98.4°F | Ht 73.0 in | Wt 291.2 lb

## 2022-12-13 DIAGNOSIS — Z7689 Persons encountering health services in other specified circumstances: Secondary | ICD-10-CM

## 2022-12-13 DIAGNOSIS — J4520 Mild intermittent asthma, uncomplicated: Secondary | ICD-10-CM

## 2022-12-13 DIAGNOSIS — E66812 Obesity, class 2: Secondary | ICD-10-CM | POA: Diagnosis not present

## 2022-12-13 DIAGNOSIS — Z6838 Body mass index (BMI) 38.0-38.9, adult: Secondary | ICD-10-CM | POA: Diagnosis not present

## 2022-12-13 DIAGNOSIS — G4733 Obstructive sleep apnea (adult) (pediatric): Secondary | ICD-10-CM

## 2022-12-13 MED ORDER — ALBUTEROL SULFATE HFA 108 (90 BASE) MCG/ACT IN AERS: 2.0000 | INHALATION_SPRAY | Freq: Four times a day (QID) | RESPIRATORY_TRACT | 3 refills | Status: AC | PRN

## 2022-12-13 NOTE — Patient Instructions (Signed)
It was nice meeting you today.  You can set up an appointment for a physical at your convenience.  If you would like to have this done prior to surgery we can schedule it with the front desk.

## 2022-12-13 NOTE — Progress Notes (Signed)
New Patient Office Visit   Subjective  Patient ID: Patty Snyder, female    DOB: 02/28/1985  Age: 37 y.o. MRN: [id number]  Chief Complaint  Patient presents with   New Patient (Initial Visit)    Pt is a 37 yo female seen for est care and follow up on chronic conditions.  Pt previously seen at [facility] by Elizabeth Palau, FNP who retired.    Obesity: Patient weighed 350 lbs.  Currently on Wegovy.  Now 291 lbs. Was walking for exercise but stopped in August after experiencing a pins-and-needles sensation in legs during walk.  Also notes getting off track with diet in August after a cruise for her anniversary.  Has gastric bypass surgery scheduled January 20, 2023.  Followed by bariatrics and general surgery at [facility].  Asthma: Has albuterol inhaler.  Also has nebulizer with solution if needed.  Symptoms typically flare if has a cold.  Followed by pulmonology, Randolm Idol, MD.  Heart issue: Patient does not recall the name or what exactly was going on.  States was referred to cardiology after LE edema and abnormal EKG.  States symptoms started after the birth of her daughter in 02/2021.  States cardiology advised her to lose weight.  Last cardiology visit 03/11/2022, Wille Glaser, MD.  Per chart review patient had atrial enlargement.  OSA: Mild OSA.  On CPAP.  Patient notes history of gestational hypertension and gestational diabetes with prior pregnancies.  Past surgical history: 03/2013 emergency surgery for ruptured cyst on ovary  Social history: Patient is married.  She has 6 children.  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  Patient endorses social alcohol use.  Patient denies current tobacco use or drug use.    Patient Active Problem List   Diagnosis Date Noted   Sleep apnea 12/01/2022   Class 3 severe obesity due to excess calories with serious comorbidity and body mass index (BMI) of 40.0 to 44.9 in adult [facility]) 10/29/2022    Vaginal discharge 08/08/2022   Possible exposure to STI 08/08/2022   Abnormal ECG 08/20/2021   Edema 08/20/2021   Snoring 08/20/2021   Chronic midline low back pain without sciatica 08/14/2021   Atrial enlargement, left 07/03/2021   IUD (intrauterine device) in place 03/20/2021   Group B Streptococcus carrier, +RV culture, currently pregnant 03/19/2021   Breech presentation 03/19/2021   Vaginal delivery 03/19/2021   Unstable lie of fetus    Multigravida of advanced maternal age in third trimester 02/09/2021   Anxiety and depression 11/12/2020   ASCUS with positive high risk HPV cervical 09/16/2020   Supervision of high risk pregnancy, antepartum 08/11/2020   BMI 40.0-44.9, adult (HCC) 08/27/2019   History of pregnancy induced hypertension 03/30/2019   Mild intermittent asthma without complication 07/15/2016   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   Rubella non-immune status, antepartum 09/11/2015   Past Medical History:  Diagnosis Date   Asthma    last used inhaler last night   Gestational hypertension 03/19/2021   History of gestational diabetes 03/30/2019   HSV (herpes simplex virus) infection    Hypertension    Past Surgical History:  Procedure Laterality Date   OVARIAN CYST SURGERY     Social History   Tobacco Use   Smoking status: Former    Types: Cigarettes    Start date: 2018    Passive exposure: Past   Smokeless tobacco: Never  Vaping Use   Vaping status: Never Used  Substance Use Topics  Alcohol use: Yes    Comment: 5 times per year   Drug use: Never   Family History  Problem Relation Age of Onset   Asthma Mother    Hyperlipidemia Father    Asthma Father    Allergies  Allergen Reactions   Iodine Nausea And Vomiting   Shellfish Allergy Nausea And Vomiting      ROS Negative unless stated above    Objective:     BP 124/82 (BP Location: Left Arm, Patient Position: Sitting, Cuff Size: Large)   Pulse 81   Temp 98.4 F (36.9 C) (Oral)   Ht 6\' 1"   (1.854 m)   Wt 291 lb 3.2 oz (132.1 kg)   LMP 12/03/2022 (Exact Date)   SpO2 98%   Breastfeeding No   BMI 38.42 kg/m  BP Readings from Last 3 Encounters:  12/13/22 124/82  08/08/22 112/79  05/23/21 117/75   Wt Readings from Last 3 Encounters:  12/13/22 291 lb 3.2 oz (132.1 kg)  05/22/21 (!) 330 lb (149.7 kg)  04/17/21 (!) 350 lb 4.8 oz (158.9 kg)      Physical Exam Constitutional:      General: She is not in acute distress.    Appearance: Normal appearance.  HENT:     Head: Normocephalic and atraumatic.     Nose: Nose normal.     Mouth/Throat:     Mouth: Mucous membranes are moist.  Cardiovascular:     Rate and Rhythm: Normal rate and regular rhythm.     Heart sounds: Normal heart sounds. No murmur heard.    No gallop.  Pulmonary:     Effort: Pulmonary effort is normal. No respiratory distress.     Breath sounds: Normal breath sounds. No wheezing, rhonchi or rales.  Skin:    General: Skin is warm and dry.  Neurological:     Mental Status: She is alert and oriented to person, place, and time.     No results found for any visits on 12/13/22.    Assessment & Plan:  Class 2 severe obesity with serious comorbidity and body mass index (BMI) of 38.0 to 38.9 in adult, unspecified obesity type (HCC) -Body mass index is 38.42 kg/m. -Continue lifestyle modifications.  Patient encouraged to restart exercise -Continue Wegovy -Gastric bypass planned for 01/20/2023 with [facility] -Continue follow-up with bariatric and general surgery at [facility]  Encounter to establish care -We reviewed the PMH, PSH, FH, SH, Meds and Allergies. -We provided refills for any medications we will prescribe as needed. -We addressed current concerns per orders and patient instructions. -We have asked for records for pertinent exams, studies, vaccines and notes from previous providers. -We have advised patient to follow up per instructions below.  Mild intermittent asthma without  complication -Controlled -Continue follow-up with pulmonology -     Albuterol Sulfate HFA; Inhale 2 puffs into the lungs every 6 (six) hours as needed for wheezing or shortness of breath.  Dispense: 8 g; Refill: 3  OSA on CPAP -Continue CPAP -Continue follow-up with pulmonology -Continue weight loss   Return if symptoms worsen or fail to improve.  Patient to schedule CPE at her convenience.  Deeann Saint, MD

## 2022-12-26 DIAGNOSIS — Z419 Encounter for procedure for purposes other than remedying health state, unspecified: Secondary | ICD-10-CM | POA: Diagnosis not present

## 2023-01-07 DIAGNOSIS — G4733 Obstructive sleep apnea (adult) (pediatric): Secondary | ICD-10-CM | POA: Diagnosis not present

## 2023-01-14 ENCOUNTER — Ambulatory Visit (INDEPENDENT_AMBULATORY_CARE_PROVIDER_SITE_OTHER): Payer: Medicaid Other | Admitting: Family Medicine

## 2023-01-14 VITALS — BP 140/78 | HR 79 | Temp 98.4°F | Ht 73.0 in | Wt 275.0 lb

## 2023-01-14 DIAGNOSIS — Z6841 Body Mass Index (BMI) 40.0 and over, adult: Secondary | ICD-10-CM | POA: Diagnosis not present

## 2023-01-14 DIAGNOSIS — Z6838 Body mass index (BMI) 38.0-38.9, adult: Secondary | ICD-10-CM

## 2023-01-14 DIAGNOSIS — J45909 Unspecified asthma, uncomplicated: Secondary | ICD-10-CM | POA: Diagnosis not present

## 2023-01-14 DIAGNOSIS — Z01812 Encounter for preprocedural laboratory examination: Secondary | ICD-10-CM | POA: Diagnosis not present

## 2023-01-14 DIAGNOSIS — E66812 Obesity, class 2: Secondary | ICD-10-CM | POA: Diagnosis not present

## 2023-01-14 DIAGNOSIS — Z0000 Encounter for general adult medical examination without abnormal findings: Secondary | ICD-10-CM | POA: Diagnosis not present

## 2023-01-14 DIAGNOSIS — G4730 Sleep apnea, unspecified: Secondary | ICD-10-CM | POA: Diagnosis not present

## 2023-01-14 DIAGNOSIS — E66813 Obesity, class 3: Secondary | ICD-10-CM | POA: Diagnosis not present

## 2023-01-14 LAB — COMPREHENSIVE METABOLIC PANEL
ALT: 21 U/L (ref 0–35)
AST: 20 U/L (ref 0–37)
Albumin: 4.4 g/dL (ref 3.5–5.2)
Alkaline Phosphatase: 76 U/L (ref 39–117)
BUN: 12 mg/dL (ref 6–23)
CO2: 28 meq/L (ref 19–32)
Calcium: 9.4 mg/dL (ref 8.4–10.5)
Chloride: 104 meq/L (ref 96–112)
Creatinine, Ser: 0.5 mg/dL (ref 0.40–1.20)
GFR: 119.53 mL/min (ref >60.00)
Glucose, Bld: 81 mg/dL (ref 70–99)
Potassium: 3.9 meq/L (ref 3.5–5.1)
Sodium: 140 meq/L (ref 135–145)
Total Bilirubin: 0.5 mg/dL (ref 0.2–1.2)
Total Protein: 6.9 g/dL (ref 6.0–8.3)

## 2023-01-14 LAB — CBC WITH DIFFERENTIAL/PLATELET
Basophils Absolute: 0 10*3/uL (ref 0.0–0.1)
Basophils Relative: 0.5 % (ref 0.0–3.0)
Eosinophils Absolute: 0.2 10*3/uL (ref 0.0–0.7)
Eosinophils Relative: 2.6 % (ref 0.0–5.0)
HCT: 43.7 % (ref 36.0–46.0)
Hemoglobin: 14.7 g/dL (ref 12.0–15.0)
Lymphocytes Relative: 41.9 % (ref 12.0–46.0)
Lymphs Abs: 2.7 10*3/uL (ref 0.7–4.0)
MCHC: 33.7 g/dL (ref 30.0–36.0)
MCV: 90.6 fL (ref 78.0–100.0)
Monocytes Absolute: 0.3 10*3/uL (ref 0.1–1.0)
Monocytes Relative: 5.3 % (ref 3.0–12.0)
Neutro Abs: 3.2 10*3/uL (ref 1.4–7.7)
Neutrophils Relative %: 49.7 % (ref 43.0–77.0)
Platelets: 171 10*3/uL (ref 150.0–400.0)
RBC: 4.82 Mil/uL (ref 3.87–5.11)
RDW: 13.7 % (ref 11.5–15.5)
WBC: 6.4 10*3/uL (ref 4.0–10.5)

## 2023-01-14 LAB — LIPID PANEL
Cholesterol: 202 mg/dL — ABNORMAL HIGH (ref 0–200)
HDL: 52.5 mg/dL (ref >39.00)
LDL Cholesterol: 138 mg/dL — ABNORMAL HIGH (ref 0–99)
NonHDL: 149.68
Total CHOL/HDL Ratio: 4
Triglycerides: 57 mg/dL (ref 0.0–149.0)
VLDL: 11.4 mg/dL (ref 0.0–40.0)

## 2023-01-14 LAB — HEMOGLOBIN A1C: Hgb A1c MFr Bld: 5.2 % (ref 4.6–6.5)

## 2023-01-14 LAB — TSH: TSH: 1.1 u[IU]/mL (ref 0.35–5.50)

## 2023-01-14 LAB — T4, FREE: Free T4: 1.05 ng/dL (ref 0.60–1.60)

## 2023-01-14 NOTE — Progress Notes (Signed)
Established Patient Office Visit   Subjective  Patient ID: Patty Snyder, female    DOB: 05-15-1985  Age: 37 y.o. MRN: [id number]  Chief Complaint  Patient presents with   Annual Exam    Patient is a 37 year old female seen for CPE.  Patient states she is doing well.  Has bariatric surgery scheduled on 12/26.  Has preop testing later today.  Not currently taking any medic occasions Wegovy stopped prior to surgery.    Patient Active Problem List   Diagnosis Date Noted   OSA (obstructive sleep apnea) 12/01/2022   Class 3 severe obesity due to excess calories with serious comorbidity and body mass index (BMI) of 40.0 to 44.9 in adult [facility]) 10/29/2022   Vaginal discharge 08/08/2022   Possible exposure to STI 08/08/2022   Abnormal ECG 08/20/2021   Diagnosis unknown 08/20/2021   Snoring 08/20/2021   Chronic midline low back pain without sciatica 08/14/2021   Atrial enlargement, left 07/03/2021   IUD (intrauterine device) in place 03/20/2021   Group B Streptococcus carrier, +RV culture, currently pregnant 03/19/2021   Breech presentation 03/19/2021   Vaginal delivery 03/19/2021   Unstable lie of fetus    Multigravida of advanced maternal age in third trimester 02/09/2021   Anxiety and depression 11/12/2020   ASCUS with positive high risk HPV cervical 09/16/2020   Supervision of high risk pregnancy, antepartum 08/11/2020   BMI 40.0-44.9, adult (HCC) 08/27/2019   History of pregnancy induced hypertension 03/30/2019   Mild intermittent asthma without complication 07/15/2016   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   Rubella non-immune status, antepartum 09/11/2015   Past Medical History:  Diagnosis Date   Asthma    last used inhaler last night   Gestational hypertension 03/19/2021   History of gestational diabetes 03/30/2019   HSV (herpes simplex virus) infection    Hypertension    Past Surgical History:  Procedure Laterality Date   OVARIAN CYST SURGERY     Social History    Tobacco Use   Smoking status: Former    Types: Cigarettes    Start date: 2018    Passive exposure: Past   Smokeless tobacco: Never  Vaping Use   Vaping status: Never Used  Substance Use Topics   Alcohol use: Yes    Comment: 5 times per year   Drug use: Never   Family History  Problem Relation Age of Onset   Asthma Mother    Hyperlipidemia Father    Asthma Father    Allergies  Allergen Reactions   Iodine Nausea And Vomiting   Shellfish Allergy Nausea And Vomiting      ROS Negative unless stated above    Objective:     BP (!) 140/78 (BP Location: Left Arm, Patient Position: Sitting, Cuff Size: Large)   Pulse 79   Temp 98.4 F (36.9 C) (Oral)   Ht 6\' 1"  (1.854 m)   Wt 275 lb (124.7 kg)   LMP 12/29/2022 (Exact Date)   SpO2 97%   Breastfeeding No   BMI 36.28 kg/m  BP Readings from Last 3 Encounters:  01/14/23 (!) 140/78  12/13/22 124/82  08/08/22 112/79   Wt Readings from Last 3 Encounters:  01/14/23 275 lb (124.7 kg)  12/13/22 291 lb 3.2 oz (132.1 kg)  05/22/21 (!) 330 lb (149.7 kg)      Physical Exam Constitutional:      Appearance: Normal appearance.  HENT:     Head: Normocephalic and atraumatic.     Right Ear: Tympanic membrane,  ear canal and external ear normal.     Left Ear: Tympanic membrane, ear canal and external ear normal.     Nose: Nose normal.     Mouth/Throat:     Mouth: Mucous membranes are moist.     Pharynx: No oropharyngeal exudate or posterior oropharyngeal erythema.  Eyes:     General: No scleral icterus.    Extraocular Movements: Extraocular movements intact.     Conjunctiva/sclera: Conjunctivae normal.     Pupils: Pupils are equal, round, and reactive to light.  Neck:     Thyroid: No thyromegaly.  Cardiovascular:     Rate and Rhythm: Normal rate and regular rhythm.     Pulses: Normal pulses.     Heart sounds: Normal heart sounds. No murmur heard.    No friction rub.  Pulmonary:     Effort: Pulmonary effort is normal.      Breath sounds: Normal breath sounds. No wheezing, rhonchi or rales.  Abdominal:     General: Bowel sounds are normal.     Palpations: Abdomen is soft.     Tenderness: There is no abdominal tenderness.  Musculoskeletal:        General: No deformity. Normal range of motion.  Lymphadenopathy:     Cervical: No cervical adenopathy.  Skin:    General: Skin is warm and dry.     Findings: No lesion.  Neurological:     General: No focal deficit present.     Mental Status: She is alert and oriented to person, place, and time.  Psychiatric:        Mood and Affect: Mood normal.        Thought Content: Thought content normal.    No results found for any visits on 01/14/23.    Assessment & Plan:  Well adult exam -     CBC with Differential/Platelet; Future -     Comprehensive metabolic panel; Future -     Hemoglobin A1c; Future -     Lipid panel; Future -     TSH; Future -     T4, free; Future  Class 2 severe obesity with serious comorbidity and body mass index (BMI) of 38.0 to 38.9 in adult, unspecified obesity type (HCC) -     CBC with Differential/Platelet; Future -     Comprehensive metabolic panel; Future -     Hemoglobin A1c; Future -     Lipid panel; Future -     TSH; Future -     T4, free; Future  Age-appropriate health screenings discussed.  Will obtain labs.  Immunizations reviewed.  Pap up-to-date done 04/22/2021. Body mass index is 36.28 kg/m.  Continue lifestyle modifications.  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   Return if symptoms worsen or fail to improve.   Deeann Saint, MD

## 2023-01-20 DIAGNOSIS — J45909 Unspecified asthma, uncomplicated: Secondary | ICD-10-CM | POA: Diagnosis not present

## 2023-01-20 DIAGNOSIS — K449 Diaphragmatic hernia without obstruction or gangrene: Secondary | ICD-10-CM | POA: Diagnosis not present

## 2023-01-20 DIAGNOSIS — G8929 Other chronic pain: Secondary | ICD-10-CM | POA: Diagnosis not present

## 2023-01-20 DIAGNOSIS — Z91013 Allergy to seafood: Secondary | ICD-10-CM | POA: Diagnosis not present

## 2023-01-20 DIAGNOSIS — Z6841 Body Mass Index (BMI) 40.0 and over, adult: Secondary | ICD-10-CM | POA: Diagnosis not present

## 2023-01-20 DIAGNOSIS — Z87891 Personal history of nicotine dependence: Secondary | ICD-10-CM | POA: Diagnosis not present

## 2023-01-20 DIAGNOSIS — Z91041 Radiographic dye allergy status: Secondary | ICD-10-CM | POA: Diagnosis not present

## 2023-01-20 DIAGNOSIS — Z6836 Body mass index (BMI) 36.0-36.9, adult: Secondary | ICD-10-CM | POA: Diagnosis not present

## 2023-01-20 DIAGNOSIS — E66813 Obesity, class 3: Secondary | ICD-10-CM | POA: Diagnosis not present

## 2023-01-20 DIAGNOSIS — G4733 Obstructive sleep apnea (adult) (pediatric): Secondary | ICD-10-CM | POA: Diagnosis not present

## 2023-01-20 DIAGNOSIS — Z8632 Personal history of gestational diabetes: Secondary | ICD-10-CM | POA: Diagnosis not present

## 2023-01-20 DIAGNOSIS — I10 Essential (primary) hypertension: Secondary | ICD-10-CM | POA: Diagnosis not present

## 2023-01-20 DIAGNOSIS — J4520 Mild intermittent asthma, uncomplicated: Secondary | ICD-10-CM | POA: Diagnosis not present

## 2023-01-20 DIAGNOSIS — M545 Low back pain, unspecified: Secondary | ICD-10-CM | POA: Diagnosis not present

## 2023-01-20 DIAGNOSIS — Z825 Family history of asthma and other chronic lower respiratory diseases: Secondary | ICD-10-CM | POA: Diagnosis not present

## 2023-01-26 DIAGNOSIS — Z419 Encounter for procedure for purposes other than remedying health state, unspecified: Secondary | ICD-10-CM | POA: Diagnosis not present

## 2023-02-01 IMAGING — US US OB COMP LESS 14 WK
1 series · 15 of 28 positions shown · non-contrast
Comparison: None.

CLINICAL DATA: Abdominal pain

EXAM:
OBSTETRIC <14 WK US AND TRANSVAGINAL OB US
TECHNIQUE: Both transabdominal and transvaginal ultrasound examinations were
performed for complete evaluation of the gestation as well as the
maternal uterus, adnexal regions, and pelvic cul-de-sac.
Transvaginal technique was performed to assess early pregnancy.

[Series 1: us ob comp less 14 wk · 33 acquisitions, 15 frames shown]
[im 1/33]
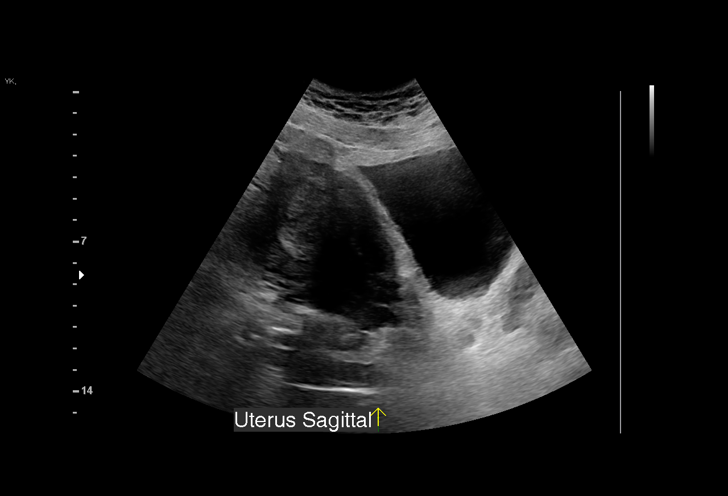
[im 3/33]
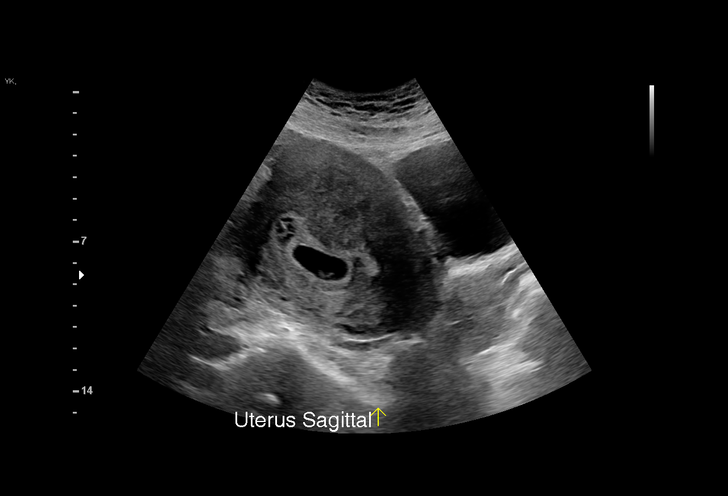
[im 5/33]
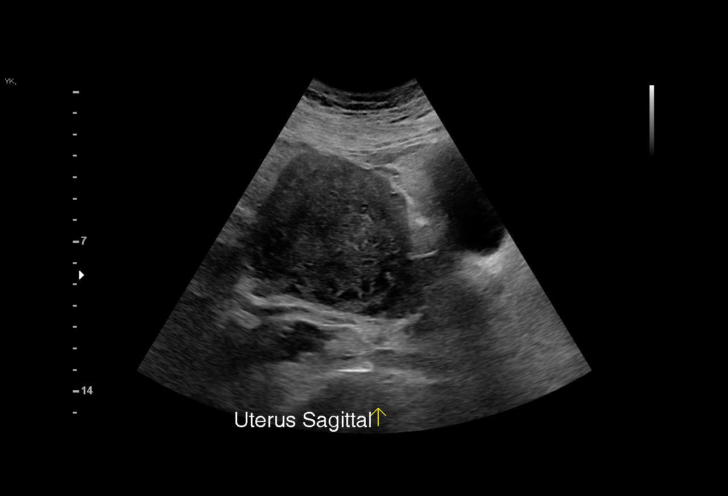
[im 8/33]
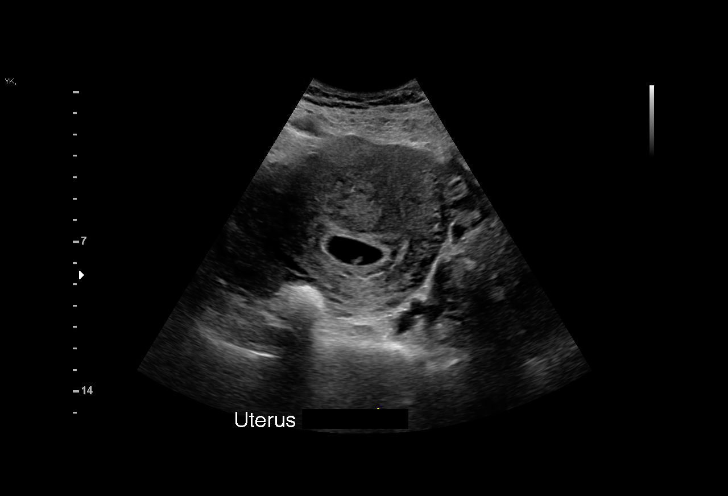
[im 10/33]
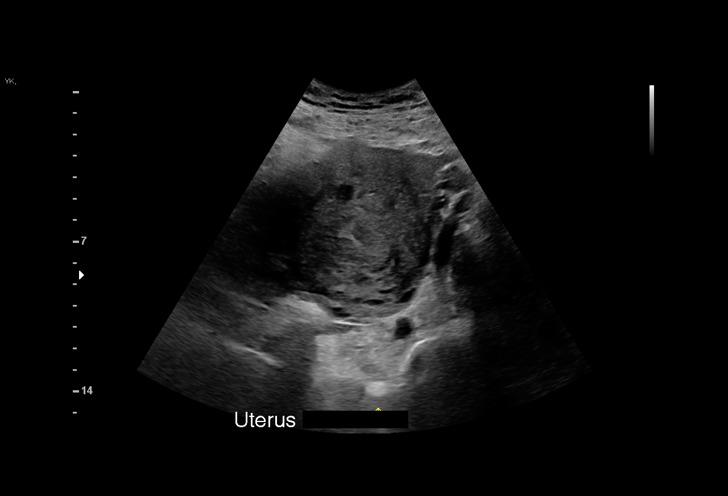
[im 12/33]
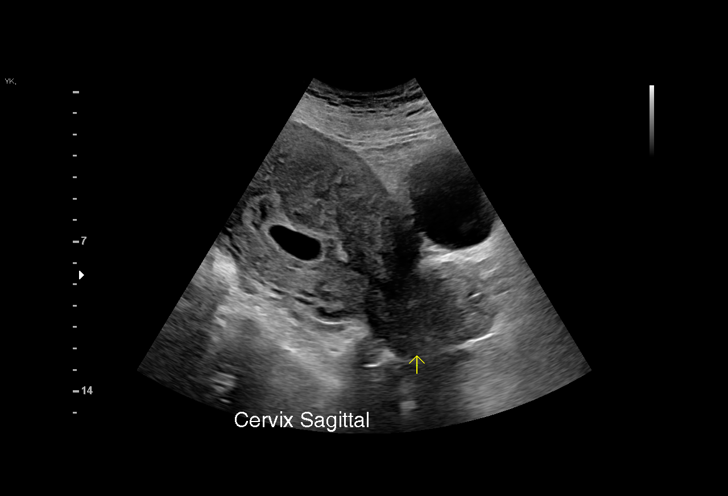
[im 15/33]
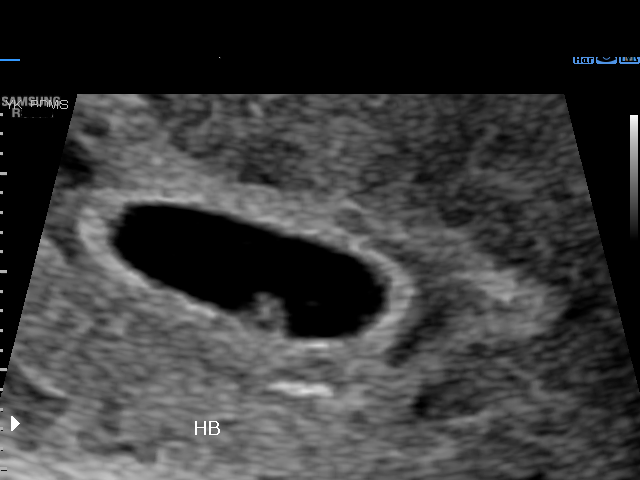
[im 17/33]
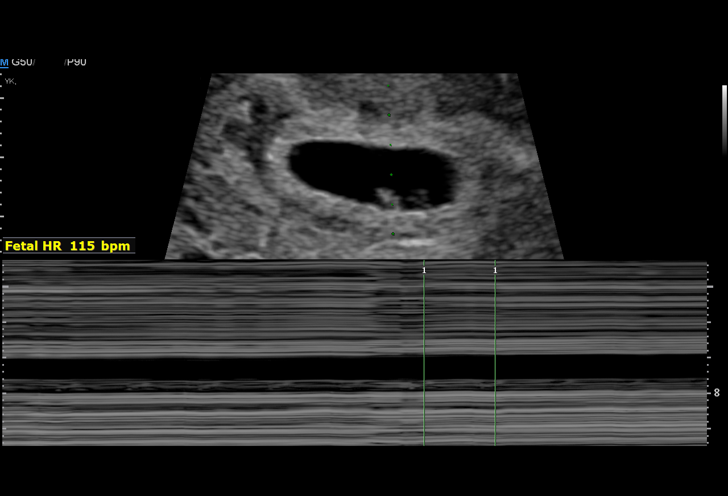
[im 18/33]
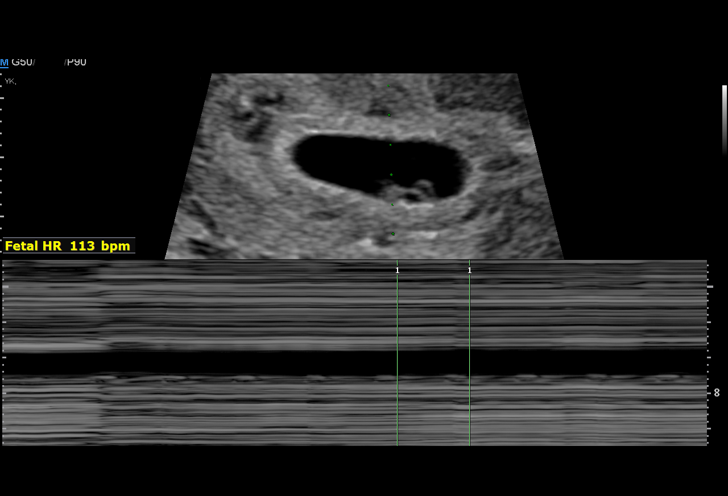
[im 21/33]
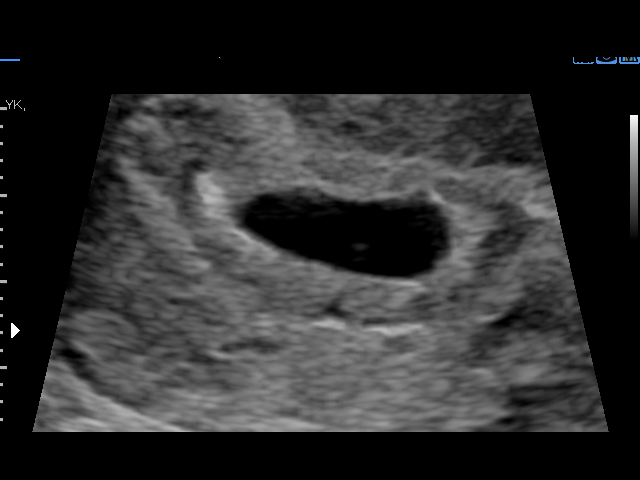
[im 23/33]
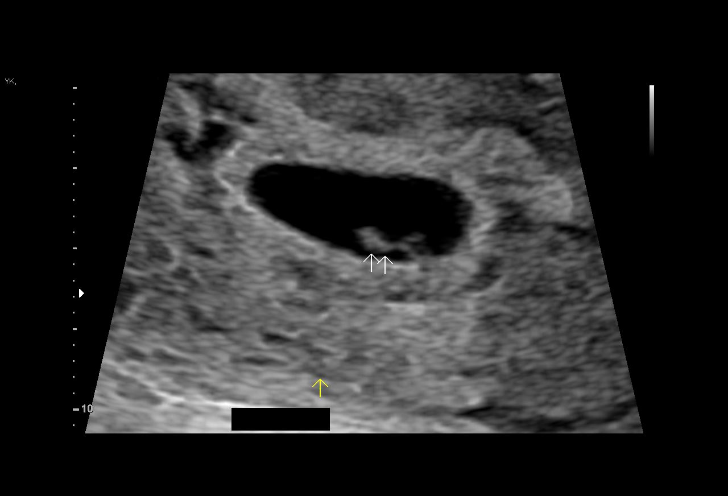
[im 25/33]
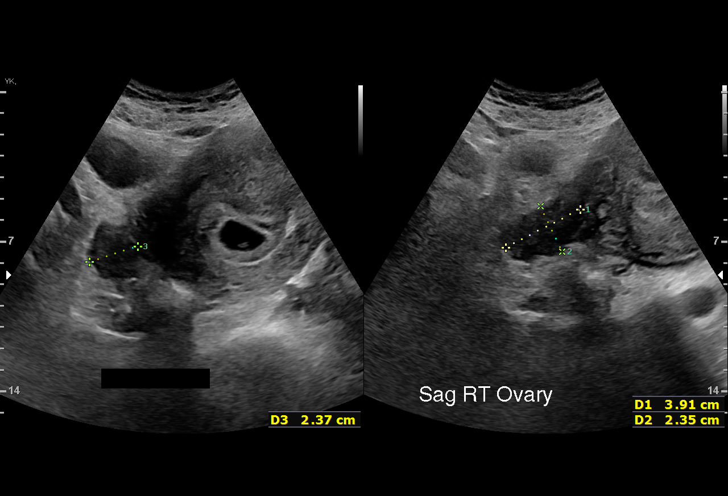
[im 28/33]
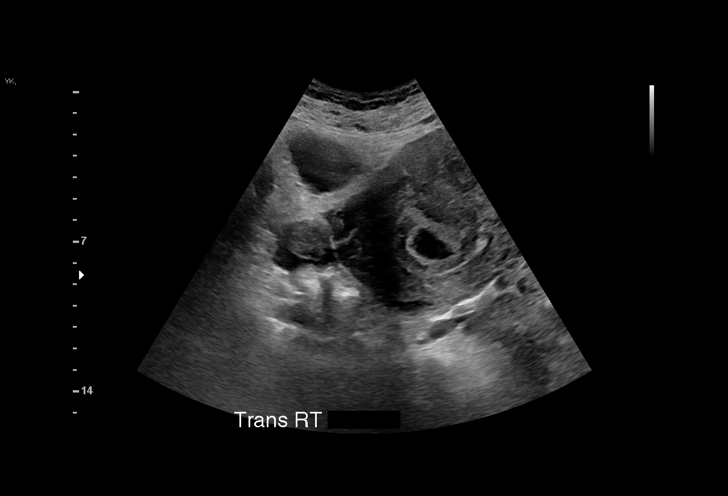
[im 30/33]
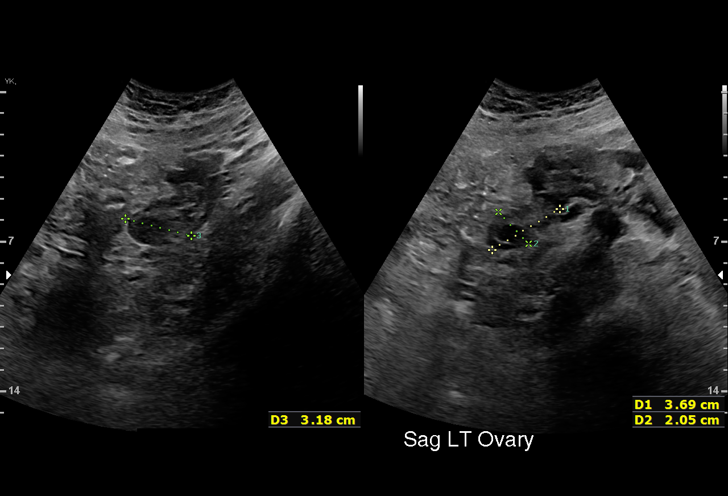
[im 33/33]
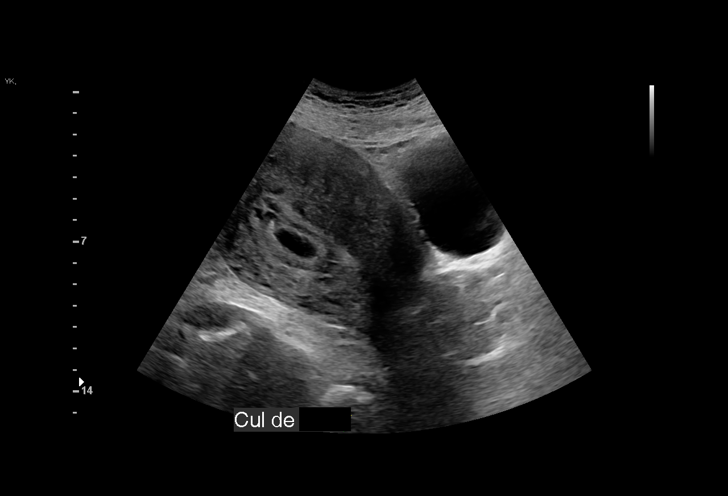

[15 of 28 positions shown; findings below may reference images not displayed]

FINDINGS: Intrauterine gestational sac: Single

Yolk sac:  Visualized.

Embryo:  Visualized.

Cardiac Activity: Visualized.

Heart Rate: 114 bpm

MSD:   mm    w     d

CRL:  4.5 mm   6 w   1 d                  US EDC: April 04, 2021

Subchorionic hemorrhage:  There is a small subchorionic hemorrhage.

Maternal uterus/adnexae: Normal
IMPRESSION: Single live IUP.  Small subchorionic hemorrhage.

## 2023-02-03 DIAGNOSIS — E66813 Obesity, class 3: Secondary | ICD-10-CM | POA: Diagnosis not present

## 2023-02-03 DIAGNOSIS — Z6841 Body Mass Index (BMI) 40.0 and over, adult: Secondary | ICD-10-CM | POA: Diagnosis not present

## 2023-02-04 DIAGNOSIS — Z6835 Body mass index (BMI) 35.0-35.9, adult: Secondary | ICD-10-CM | POA: Diagnosis not present

## 2023-02-04 DIAGNOSIS — E66812 Obesity, class 2: Secondary | ICD-10-CM | POA: Diagnosis not present

## 2023-02-06 DIAGNOSIS — Z419 Encounter for procedure for purposes other than remedying health state, unspecified: Secondary | ICD-10-CM | POA: Diagnosis not present

## 2023-02-07 DIAGNOSIS — G4733 Obstructive sleep apnea (adult) (pediatric): Secondary | ICD-10-CM | POA: Diagnosis not present

## 2023-02-17 DIAGNOSIS — Z6841 Body Mass Index (BMI) 40.0 and over, adult: Secondary | ICD-10-CM | POA: Diagnosis not present

## 2023-02-17 DIAGNOSIS — E66813 Obesity, class 3: Secondary | ICD-10-CM | POA: Diagnosis not present

## 2023-02-20 DIAGNOSIS — Z9884 Bariatric surgery status: Secondary | ICD-10-CM | POA: Diagnosis not present

## 2023-02-20 DIAGNOSIS — E860 Dehydration: Secondary | ICD-10-CM | POA: Diagnosis not present

## 2023-02-20 DIAGNOSIS — E876 Hypokalemia: Secondary | ICD-10-CM | POA: Diagnosis not present

## 2023-02-26 DIAGNOSIS — Z419 Encounter for procedure for purposes other than remedying health state, unspecified: Secondary | ICD-10-CM | POA: Diagnosis not present

## 2023-03-03 DIAGNOSIS — E66813 Obesity, class 3: Secondary | ICD-10-CM | POA: Diagnosis not present

## 2023-03-03 DIAGNOSIS — Z6841 Body Mass Index (BMI) 40.0 and over, adult: Secondary | ICD-10-CM | POA: Diagnosis not present

## 2023-03-16 DIAGNOSIS — F54 Psychological and behavioral factors associated with disorders or diseases classified elsewhere: Secondary | ICD-10-CM | POA: Diagnosis not present

## 2023-03-16 DIAGNOSIS — Z6835 Body mass index (BMI) 35.0-35.9, adult: Secondary | ICD-10-CM | POA: Diagnosis not present

## 2023-03-26 DIAGNOSIS — Z419 Encounter for procedure for purposes other than remedying health state, unspecified: Secondary | ICD-10-CM | POA: Diagnosis not present

## 2023-03-30 ENCOUNTER — Ambulatory Visit: Payer: Medicaid Other | Admitting: Family Medicine

## 2023-04-26 DIAGNOSIS — Z6833 Body mass index (BMI) 33.0-33.9, adult: Secondary | ICD-10-CM | POA: Diagnosis not present

## 2023-04-26 DIAGNOSIS — Z9884 Bariatric surgery status: Secondary | ICD-10-CM | POA: Diagnosis not present

## 2023-04-26 DIAGNOSIS — Z713 Dietary counseling and surveillance: Secondary | ICD-10-CM | POA: Diagnosis not present

## 2023-04-26 DIAGNOSIS — E785 Hyperlipidemia, unspecified: Secondary | ICD-10-CM | POA: Diagnosis not present

## 2023-04-26 DIAGNOSIS — Z7182 Exercise counseling: Secondary | ICD-10-CM | POA: Diagnosis not present

## 2023-04-26 DIAGNOSIS — E66811 Obesity, class 1: Secondary | ICD-10-CM | POA: Diagnosis not present

## 2023-04-28 IMAGING — US US MFM OB DETAIL+14 WK
1 series · 13 of 28 positions shown · non-contrast
Comparison: none

[Series 1: us mfm ob detail+14 wk · 13 of 74 slices shown]
[im 3/74]
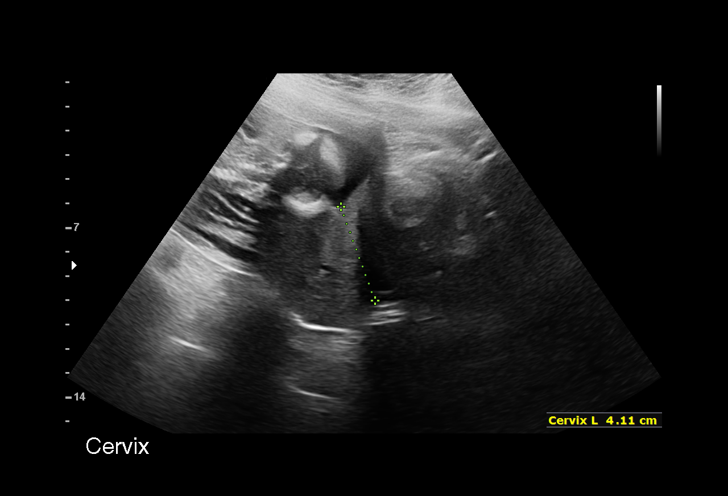
[im 9/74]
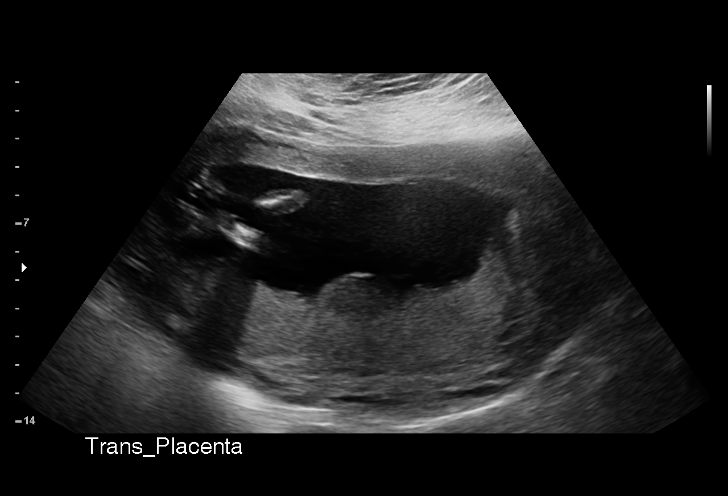
[im 14/74]
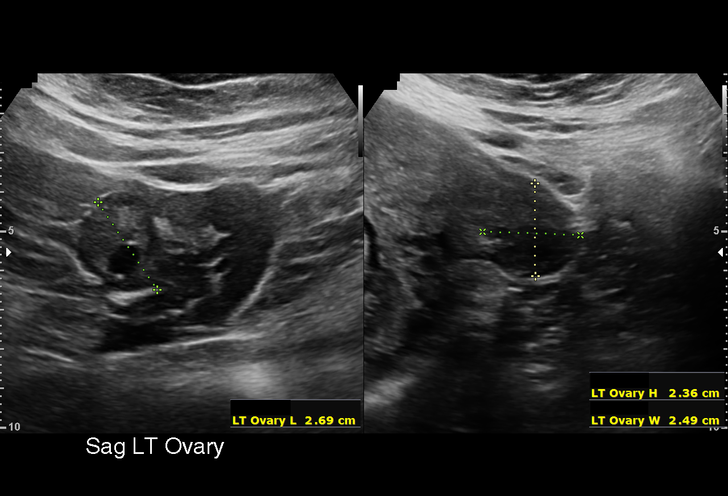
[im 19/74]
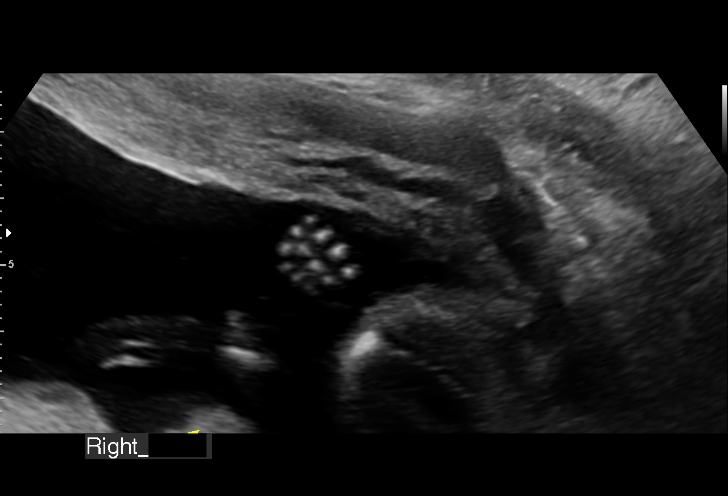
[im 25/74]
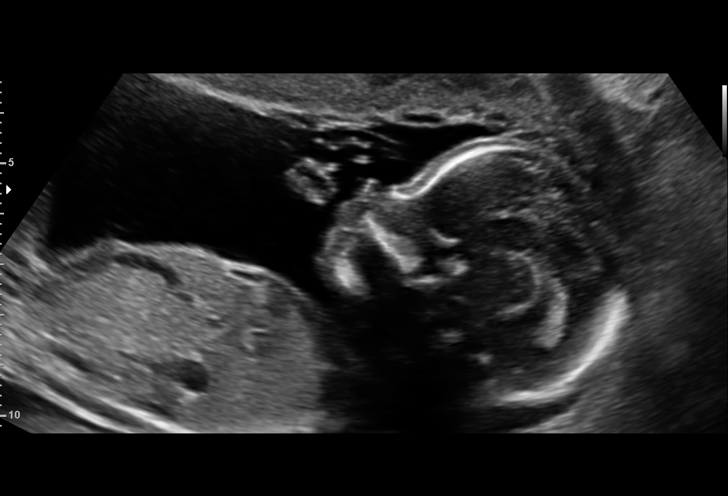
[im 30/74]
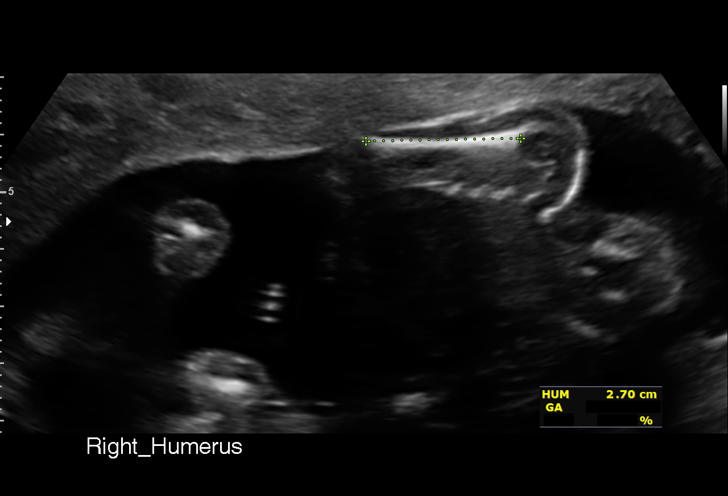
[im 38/74]
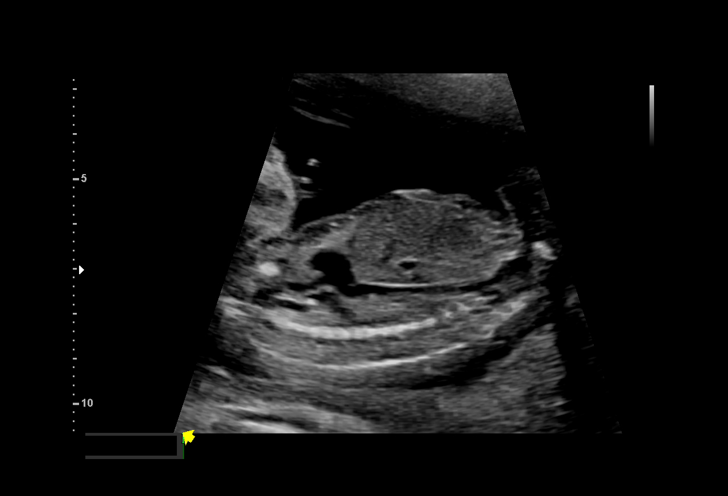
[im 44/74]
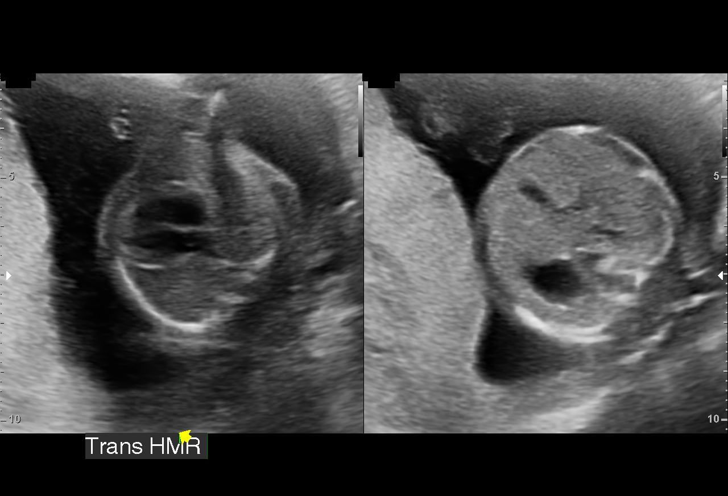
[im 49/74]
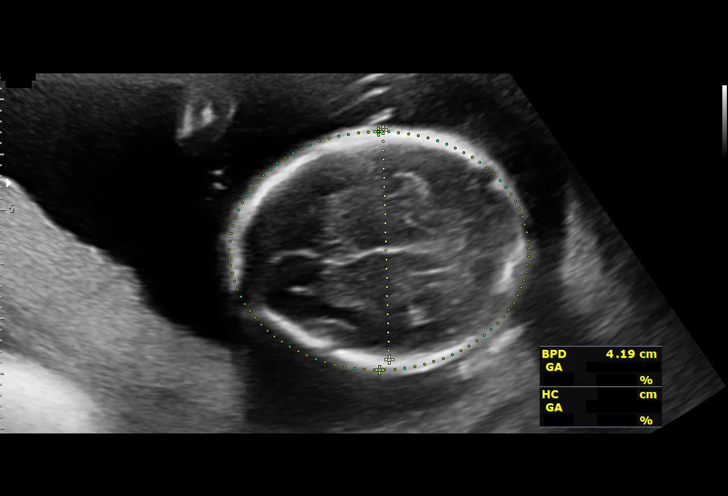
[im 55/74]
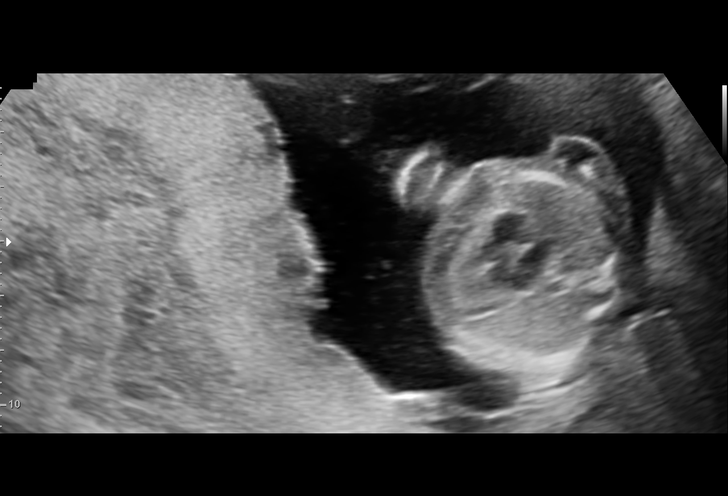
[im 60/74]
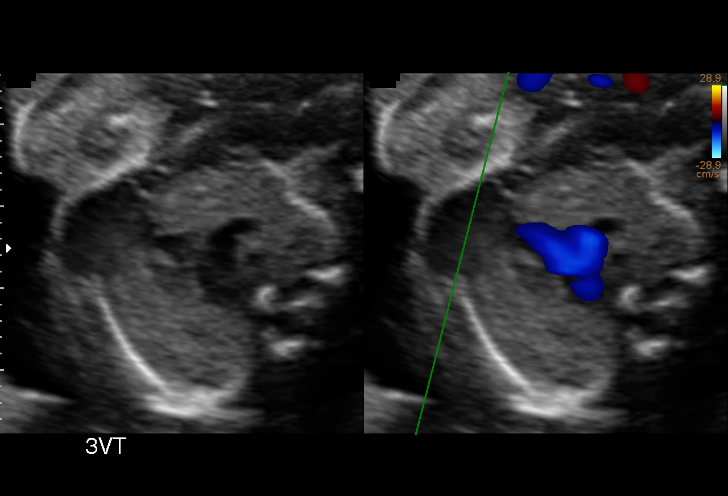
[im 65/74]
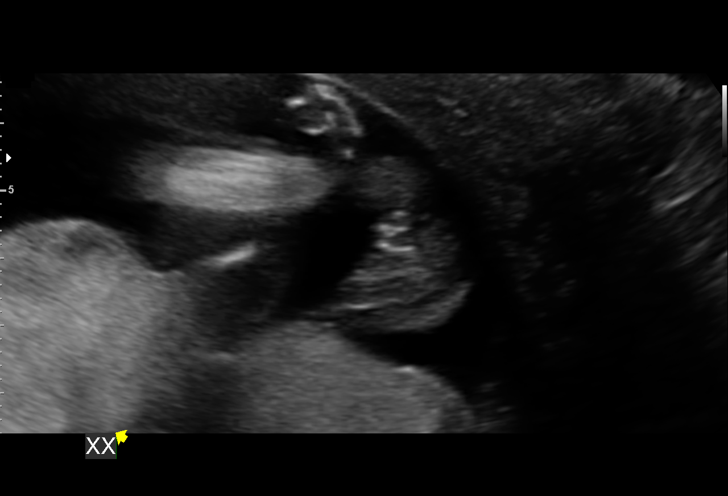
[im 71/74]
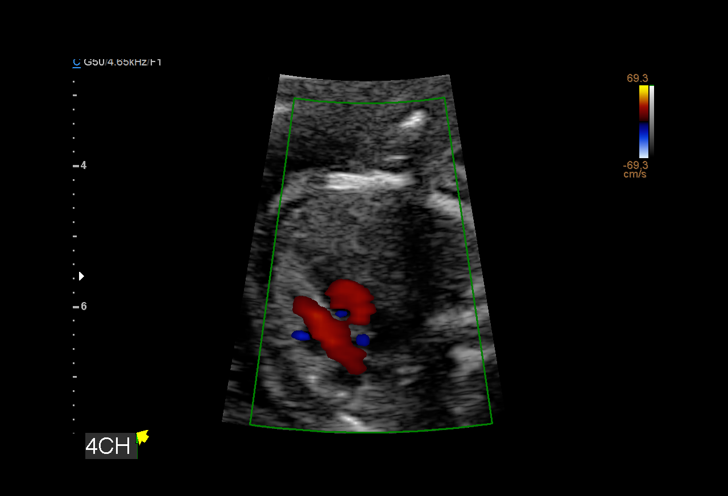

[13 of 28 positions shown; findings below may reference images not displayed]

FOKIR NP

Indications

 Advanced maternal age multigravida 35+,
 second trimester
 Obesity complicating pregnancy, second
 trimester (BMI 46)
 18 weeks gestation of pregnancy
 Poor obstetric history: Previous
 preeclampsia / eclampsia/gestational HTN
 Poor obstetric history: Previous gestational
 diabetes
 Asthma                                         I66.E6 [id number]
 LR NIPS/ horizon
 Encounter for antenatal screening for
 malformations (LR NIPS)
Fetal Evaluation

 Num Of Fetuses:         1
 Fetal Heart Rate(bpm):  150
 Cardiac Activity:       Observed
 Presentation:           Transverse, head to maternal right
 Placenta:               Posterior
 P. Cord Insertion:      Visualized, central

 Amniotic Fluid
 AFI FV:      Within normal limits

                             Largest Pocket(cm)

Biometry
 BPD:      41.7  mm     G. Age:  18w 4d         40  %    CI:        72.16   %    70 - 86
                                                         FL/HC:      18.2   %    16.1 -
 HC:      156.2  mm     G. Age:  18w 4d         27  %    HC/AC:      1.13        1.09 -
 AC:      138.6  mm     G. Age:  19w 2d         60  %    FL/BPD:     68.3   %
 FL:       28.5  mm     G. Age:  18w 5d         39  %    FL/AC:      20.6   %    20 - 24
 HUM:      26.6  mm     G. Age:  18w 3d         39  %
 CER:      19.9  mm     G. Age:  19w 2d         56  %
 NFT:       3.4  mm

 LV:        6.6  mm
 CM:        5.6  mm

 Est. FW:     267  gm      0 lb 9 oz     52  %
OB History

 Gravidity:    10        Term:   5         SAB:   1
 TOP:          3
Gestational Age

 LMP:           18w 6d        Date:  06/25/20                 EDD:   04/01/21
 U/S Today:     18w 6d                                        EDD:   04/01/21
 Best:          18w 6d     Det. By:  LMP  (06/25/20)          EDD:   04/01/21
Anatomy

 Cranium:               Appears normal         LVOT:                   Appears normal
 Cavum:                 Appears normal         Aortic Arch:            Appears normal
 Ventricles:            Appears normal         Ductal Arch:            Not well visualized
 Choroid Plexus:        Appears normal         Diaphragm:              Appears normal
 Cerebellum:            Appears normal         Stomach:                Appears normal, left
                                                                       sided
 Posterior Fossa:       Appears normal         Abdomen:                Appears normal
 Nuchal Fold:           Appears normal         Abdominal Wall:         Appears nml (cord
                                                                       insert, abd wall)
 Face:                  Appears normal         Cord Vessels:           Appears normal (3
                        (orbits and profile)                           vessel cord)
 Lips:                  Appears normal         Kidneys:                Appear normal
 Palate:                Appears normal         Bladder:                Appears normal
 Thoracic:              Appears normal         Spine:                  Not well visualized
 Heart:                 Not well visualized    Upper Extremities:      Appears normal
 RVOT:                  Not well visualized    Lower Extremities:      Appears normal

 Other:  Fetus appears to be female. Nasal bone, lenses, heels/feet, open
         hands/5th digits, VC, 3VV, 3VT visualized. Technically difficult due to
         maternal habitus and fetal position.
Targeted Anatomy

 Thorax
 SVC:                   Appears normal         3 V Trachea View:       Appears normal
 3 Vessel View:         Appears normal         IVC:                    Appears normal
Cervix Uterus Adnexa
 Cervix
 Length:           4.11  cm.
 Normal appearance by transabdominal scan.

 Uterus
 No abnormality visualized.

 Right Ovary
 Within normal limits.

 Left Ovary
 Within normal limits.

 Adnexa
 No adnexal mass visualized.
Impression

 G10 P5. Patient is here for fetal anatomy scan .
 On cell-free fetal DNA screening, the risks of fetal
 aneuploidies are not increased .

 Obstetric history significant for 5 term vaginal deliveries.

 We performed fetal anatomy scan. No makers of
 aneuploidies or fetal structural defects are seen. Fetal
 biometry is consistent with her previously-established dates.
 Amniotic fluid is normal and good fetal activity is seen.
 Patient understands the limitations of ultrasound in detecting
 fetal anomalies.
 As maternal obesity limits resolution of images, failure to
 detect anomalies are more common .
Recommendations

 -An appointment was made for her to return in 4 weeks for
 completion of fetal anatomy.
                 Philippe, Cybill

## 2023-05-02 DIAGNOSIS — F54 Psychological and behavioral factors associated with disorders or diseases classified elsewhere: Secondary | ICD-10-CM | POA: Diagnosis not present

## 2023-05-02 DIAGNOSIS — Z6833 Body mass index (BMI) 33.0-33.9, adult: Secondary | ICD-10-CM | POA: Diagnosis not present

## 2023-05-04 DIAGNOSIS — I10 Essential (primary) hypertension: Secondary | ICD-10-CM | POA: Diagnosis not present

## 2023-05-04 DIAGNOSIS — R609 Edema, unspecified: Secondary | ICD-10-CM | POA: Diagnosis not present

## 2023-05-04 DIAGNOSIS — R600 Localized edema: Secondary | ICD-10-CM | POA: Diagnosis not present

## 2023-05-05 DIAGNOSIS — R609 Edema, unspecified: Secondary | ICD-10-CM | POA: Diagnosis not present

## 2023-05-07 DIAGNOSIS — Z419 Encounter for procedure for purposes other than remedying health state, unspecified: Secondary | ICD-10-CM | POA: Diagnosis not present

## 2023-06-06 DIAGNOSIS — Z419 Encounter for procedure for purposes other than remedying health state, unspecified: Secondary | ICD-10-CM | POA: Diagnosis not present

## 2023-07-07 DIAGNOSIS — Z419 Encounter for procedure for purposes other than remedying health state, unspecified: Secondary | ICD-10-CM | POA: Diagnosis not present

## 2023-07-11 DIAGNOSIS — Z3200 Encounter for pregnancy test, result unknown: Secondary | ICD-10-CM | POA: Diagnosis not present

## 2023-08-06 DIAGNOSIS — Z419 Encounter for procedure for purposes other than remedying health state, unspecified: Secondary | ICD-10-CM | POA: Diagnosis not present

## 2023-08-15 DIAGNOSIS — R87610 Atypical squamous cells of undetermined significance on cytologic smear of cervix (ASC-US): Secondary | ICD-10-CM | POA: Diagnosis not present

## 2023-08-15 DIAGNOSIS — Z124 Encounter for screening for malignant neoplasm of cervix: Secondary | ICD-10-CM | POA: Diagnosis not present

## 2023-08-15 DIAGNOSIS — N841 Polyp of cervix uteri: Secondary | ICD-10-CM | POA: Diagnosis not present

## 2023-08-15 DIAGNOSIS — Z302 Encounter for sterilization: Secondary | ICD-10-CM | POA: Diagnosis not present

## 2023-08-15 DIAGNOSIS — Z1151 Encounter for screening for human papillomavirus (HPV): Secondary | ICD-10-CM | POA: Diagnosis not present

## 2023-09-06 DIAGNOSIS — Z419 Encounter for procedure for purposes other than remedying health state, unspecified: Secondary | ICD-10-CM | POA: Diagnosis not present

## 2023-10-07 DIAGNOSIS — Z419 Encounter for procedure for purposes other than remedying health state, unspecified: Secondary | ICD-10-CM | POA: Diagnosis not present

## 2023-10-28 DIAGNOSIS — L243 Irritant contact dermatitis due to cosmetics: Secondary | ICD-10-CM | POA: Diagnosis not present

## 2023-10-28 DIAGNOSIS — Z681 Body mass index (BMI) 19 or less, adult: Secondary | ICD-10-CM | POA: Diagnosis not present

## 2023-11-06 DIAGNOSIS — Z419 Encounter for procedure for purposes other than remedying health state, unspecified: Secondary | ICD-10-CM | POA: Diagnosis not present

## 2024-01-06 DIAGNOSIS — Z419 Encounter for procedure for purposes other than remedying health state, unspecified: Secondary | ICD-10-CM | POA: Diagnosis not present

## 2024-01-20 DIAGNOSIS — Z6829 Body mass index (BMI) 29.0-29.9, adult: Secondary | ICD-10-CM | POA: Diagnosis not present

## 2024-01-20 DIAGNOSIS — Z8639 Personal history of other endocrine, nutritional and metabolic disease: Secondary | ICD-10-CM | POA: Diagnosis not present

## 2024-01-20 DIAGNOSIS — E785 Hyperlipidemia, unspecified: Secondary | ICD-10-CM | POA: Diagnosis not present

## 2024-01-20 DIAGNOSIS — Z9884 Bariatric surgery status: Secondary | ICD-10-CM | POA: Diagnosis not present
